# Patient Record
Sex: Female | Born: 1980 | Race: White | Hispanic: No | Marital: Married | Smoking: Never smoker
Health system: Southern US, Community
[De-identification: ages and names within clinical notes are randomized; demographics above are authoritative.]

## PROBLEM LIST (undated history)

## (undated) DIAGNOSIS — E079 Disorder of thyroid, unspecified: Secondary | ICD-10-CM

## (undated) DIAGNOSIS — F419 Anxiety disorder, unspecified: Secondary | ICD-10-CM

## (undated) HISTORY — DX: Anxiety disorder, unspecified: F41.9

## (undated) HISTORY — DX: Disorder of thyroid, unspecified: E07.9

---

## 1999-12-05 ENCOUNTER — Emergency Department (HOSPITAL_COMMUNITY): Admission: EM | Admit: 1999-12-05 | Discharge: 1999-12-05 | Payer: Self-pay | Admitting: Emergency Medicine

## 2000-03-31 ENCOUNTER — Ambulatory Visit (HOSPITAL_BASED_OUTPATIENT_CLINIC_OR_DEPARTMENT_OTHER): Admission: RE | Admit: 2000-03-31 | Discharge: 2000-04-01 | Payer: Self-pay | Admitting: Otolaryngology

## 2000-03-31 ENCOUNTER — Encounter (INDEPENDENT_AMBULATORY_CARE_PROVIDER_SITE_OTHER): Payer: Self-pay | Admitting: *Deleted

## 2000-04-19 ENCOUNTER — Other Ambulatory Visit: Admission: RE | Admit: 2000-04-19 | Discharge: 2000-04-19 | Payer: Self-pay | Admitting: Gynecology

## 2001-07-31 ENCOUNTER — Emergency Department (HOSPITAL_COMMUNITY): Admission: EM | Admit: 2001-07-31 | Discharge: 2001-08-01 | Payer: Self-pay | Admitting: Emergency Medicine

## 2002-12-12 ENCOUNTER — Other Ambulatory Visit: Admission: RE | Admit: 2002-12-12 | Discharge: 2002-12-12 | Payer: Self-pay | Admitting: Gynecology

## 2003-09-24 ENCOUNTER — Other Ambulatory Visit: Admission: RE | Admit: 2003-09-24 | Discharge: 2003-09-24 | Payer: Self-pay | Admitting: Gynecology

## 2004-01-03 ENCOUNTER — Inpatient Hospital Stay (HOSPITAL_COMMUNITY): Admission: AD | Admit: 2004-01-03 | Discharge: 2004-01-03 | Payer: Self-pay | Admitting: Gynecology

## 2004-02-12 ENCOUNTER — Encounter: Admission: RE | Admit: 2004-02-12 | Discharge: 2004-02-12 | Payer: Self-pay | Admitting: Gastroenterology

## 2004-04-28 ENCOUNTER — Inpatient Hospital Stay (HOSPITAL_COMMUNITY): Admission: AD | Admit: 2004-04-28 | Discharge: 2004-05-01 | Payer: Self-pay | Admitting: Gynecology

## 2004-06-15 ENCOUNTER — Other Ambulatory Visit: Admission: RE | Admit: 2004-06-15 | Discharge: 2004-06-15 | Payer: Self-pay | Admitting: Gynecology

## 2004-07-01 ENCOUNTER — Other Ambulatory Visit: Admission: RE | Admit: 2004-07-01 | Discharge: 2004-07-01 | Payer: Self-pay | Admitting: Internal Medicine

## 2004-08-06 IMAGING — US US ABDOMEN COMPLETE
1 series · 14 of 25 positions shown · non-contrast
Comparison: none

CLINICAL DATA: Nausea.
 UPPER ABDOMEN ULTRASOUND COMPLETE
 Normal gallbladder size and contour.  There is a persistent 2 mm sonodensity along the anterior wall of the gallbladder that does not move.  This is likely a small polyp.  No stones or sludge.  No biliary dilatation.  The liver, spleen, and pancreas are normal.  The IVC and aorta are normal.  
 There is mild to moderate right hydronephrosis which can be seen physiologically in the third trimester of pregnancy.  The left kidney is normal.
 IMPRESSION
 1.  There is a 2 mm gallbladder polyp.
 2.  There is mild to moderate right hydronephrosis which is probably physiological in this stage of pregnancy.
 3.  No other findings of significance.

[Series 1: unknown · 0.27mm/px · 14 of 74 slices shown]
[im 1/74]
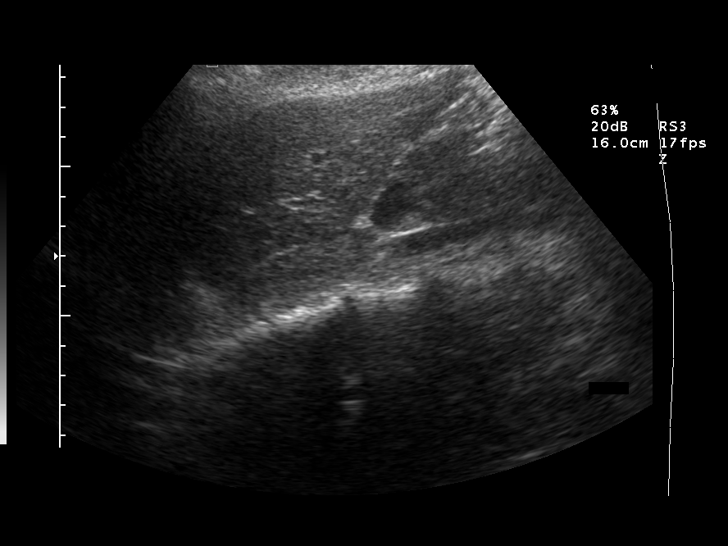
[im 7/74]
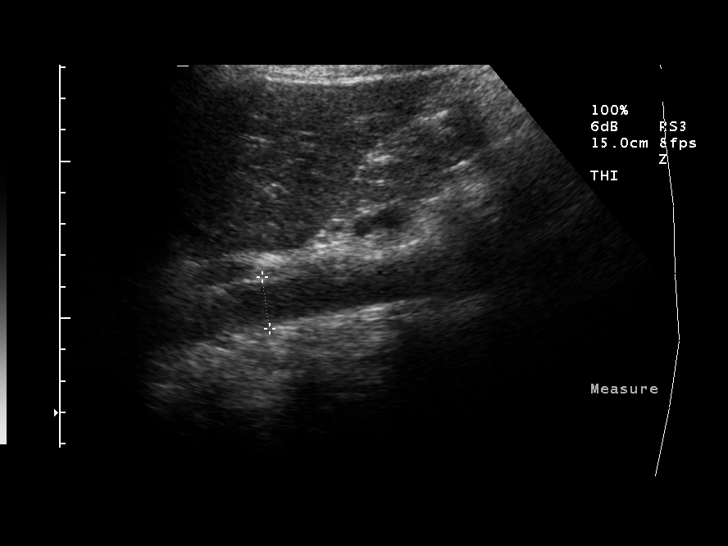
[im 13/74]
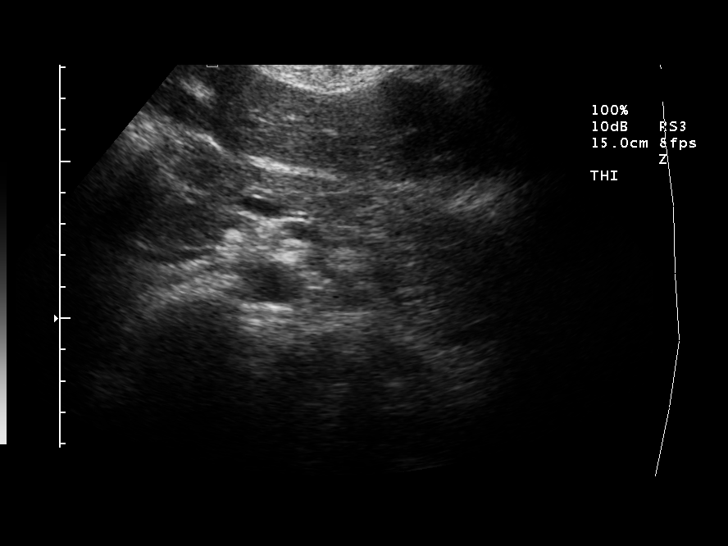
[im 19/74]
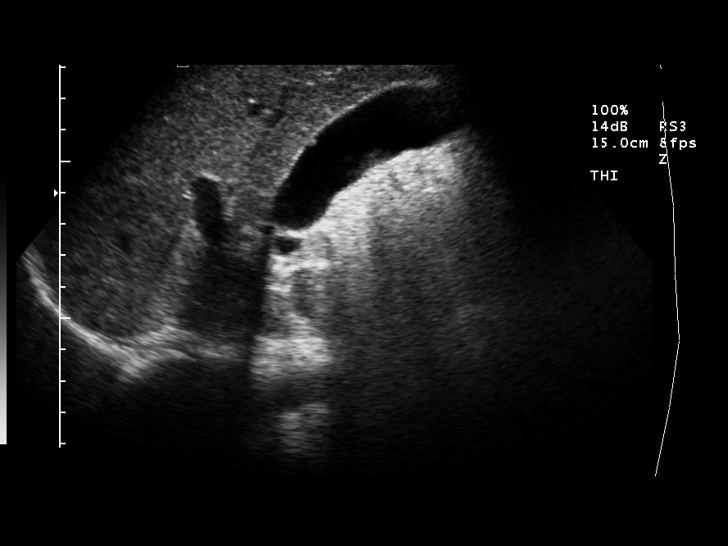
[im 25/74]
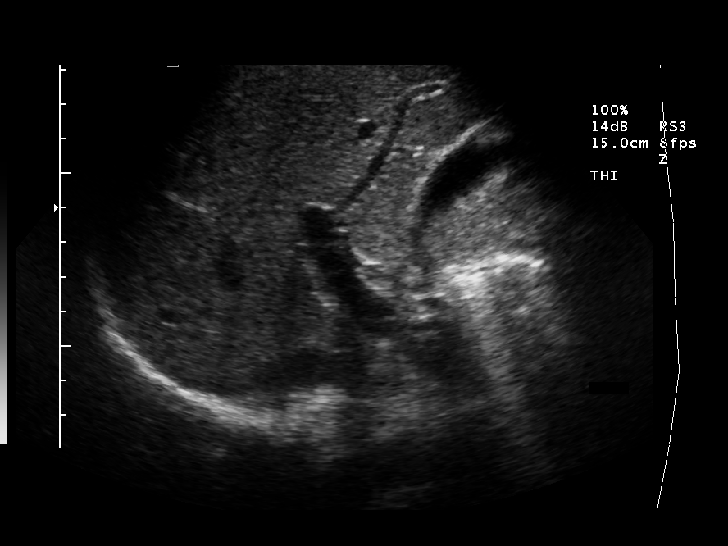
[im 28/74]
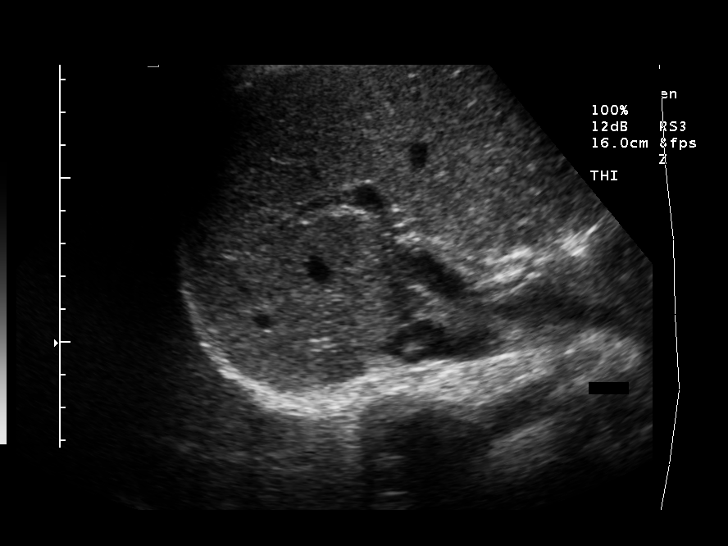
[im 34/74]
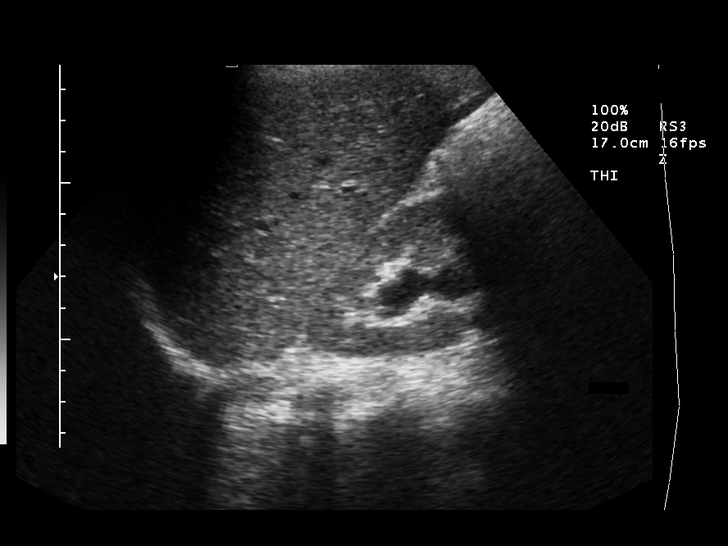
[im 40/74]
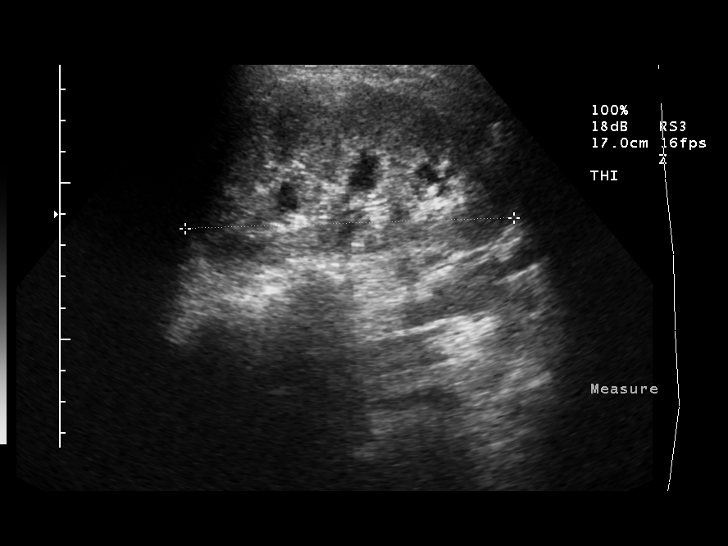
[im 46/74]
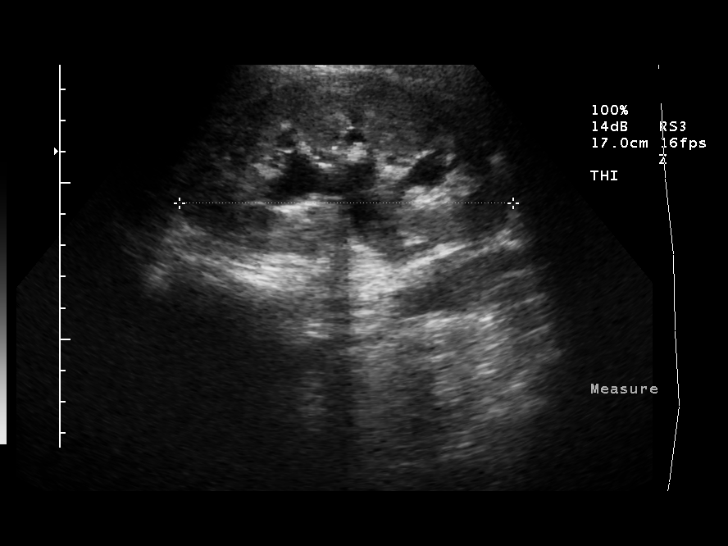
[im 49/74]
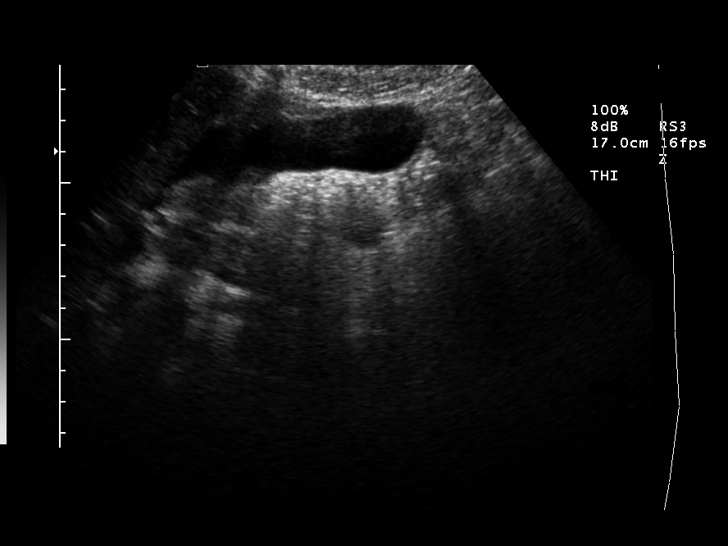
[im 55/74]
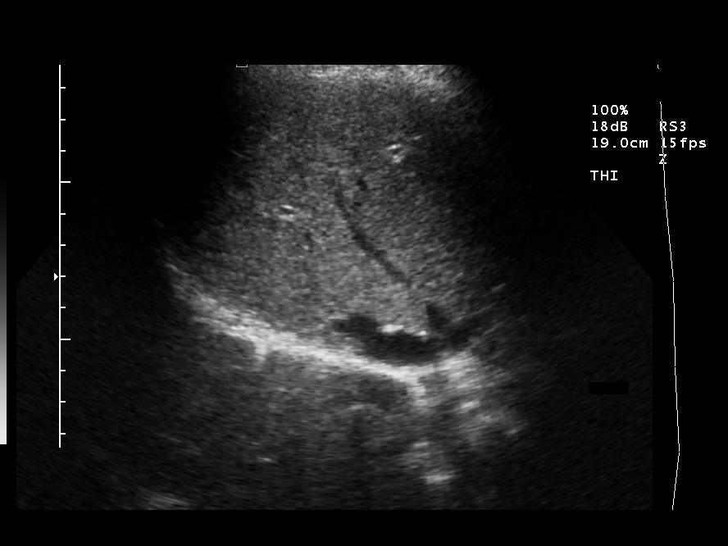
[im 61/74]
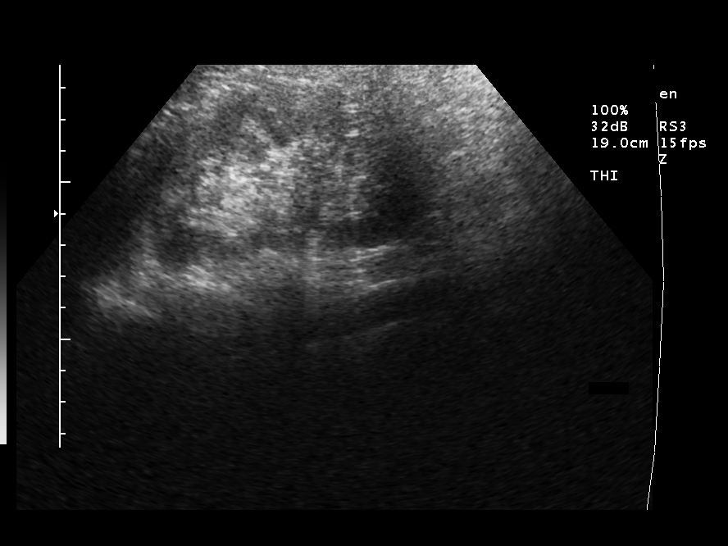
[im 67/74]
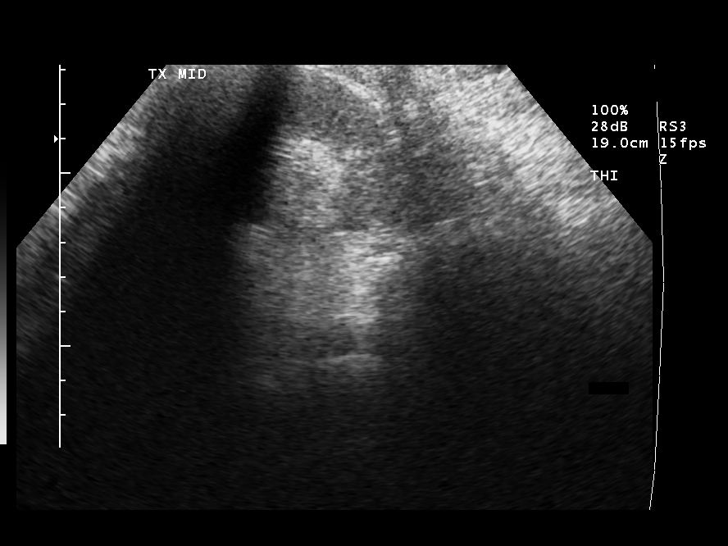
[im 74/74]
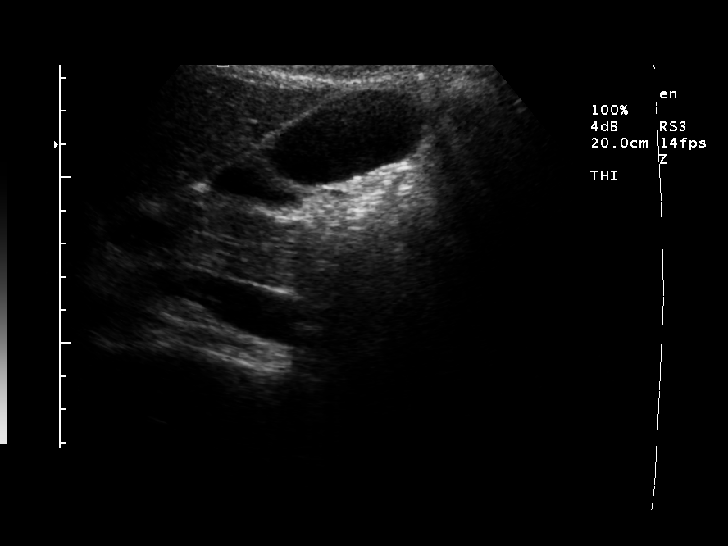

[14 of 25 positions shown; findings below may reference images not displayed]

## 2004-09-24 ENCOUNTER — Ambulatory Visit: Payer: Self-pay | Admitting: Internal Medicine

## 2004-09-27 ENCOUNTER — Encounter: Admission: RE | Admit: 2004-09-27 | Discharge: 2004-09-27 | Payer: Self-pay | Admitting: Family Medicine

## 2004-11-08 ENCOUNTER — Ambulatory Visit: Payer: Self-pay | Admitting: Family Medicine

## 2005-03-09 ENCOUNTER — Other Ambulatory Visit: Admission: RE | Admit: 2005-03-09 | Discharge: 2005-03-09 | Payer: Self-pay | Admitting: Gynecology

## 2005-03-22 IMAGING — US US SOFT TISSUE HEAD/NECK
1 series · 14 of 25 positions shown · non-contrast
Comparison: none

CLINICAL DATA: Goiter on physical exam. 
THYROID ULTRASOUND: 
No comparison.  Both lobes of the thyroid are heterogeneous in echotexture.  No focal thyroid nodules are demonstrated.  The right lobe measures 6.0 x 2.5 x 2.6cm and the left lobe 5.8 x 2.2 x 2.5cm.  The isthmus measures 10mm in thickness.  Several prominent lymph nodes are noted bilaterally, all having short axis dimensions less than 1cm.  These are not pathologically enlarged by size criteria and are non specific.

[Series 1: unknown · 0.09mm/px · 14 of 53 slices shown]
[im 1/53]
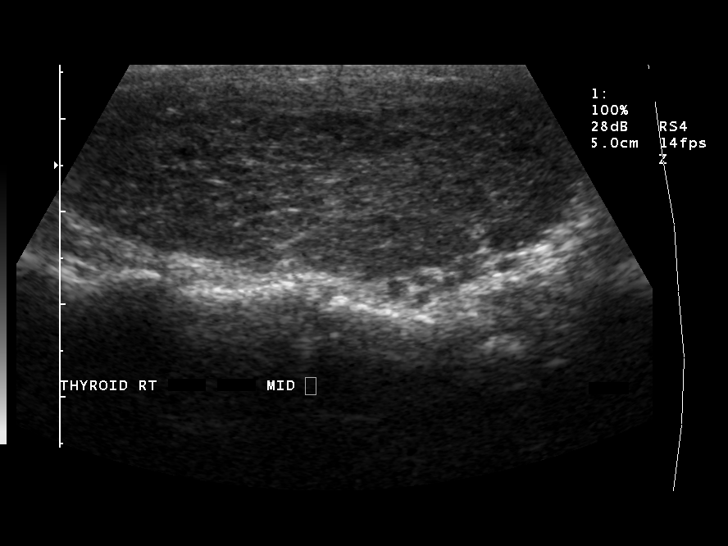
[im 5/53]
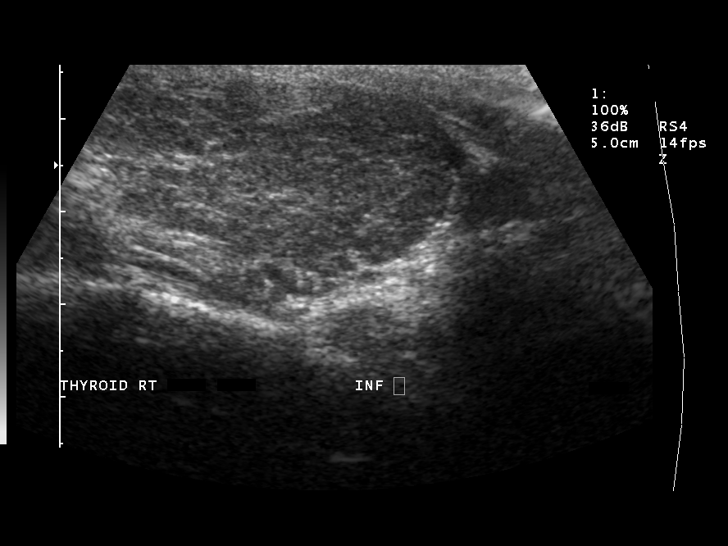
[im 9/53]
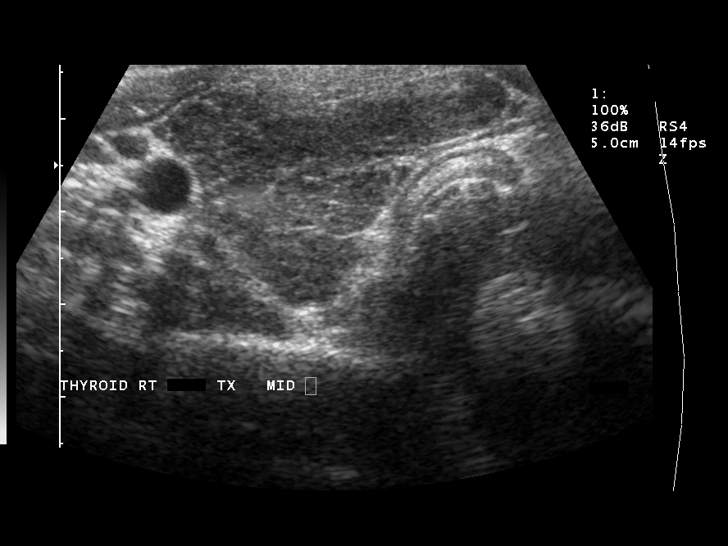
[im 14/53]
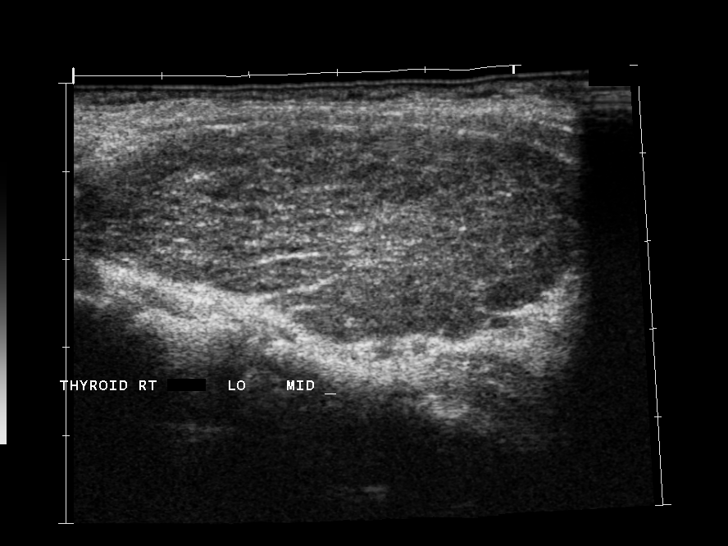
[im 18/53]
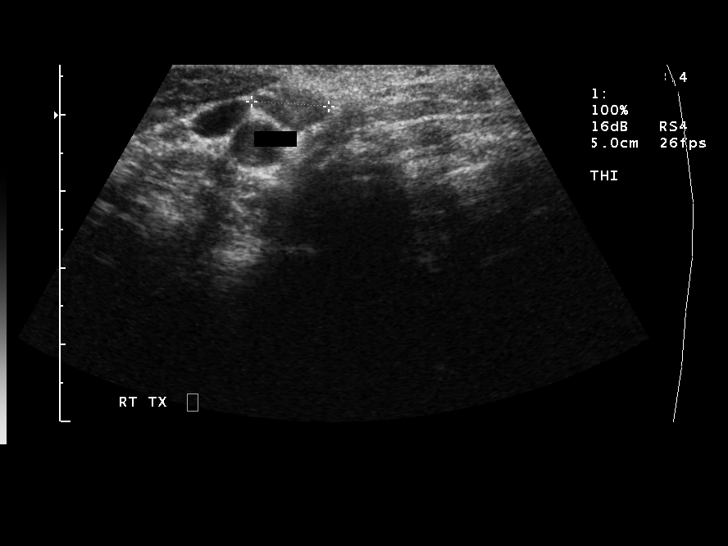
[im 20/53]
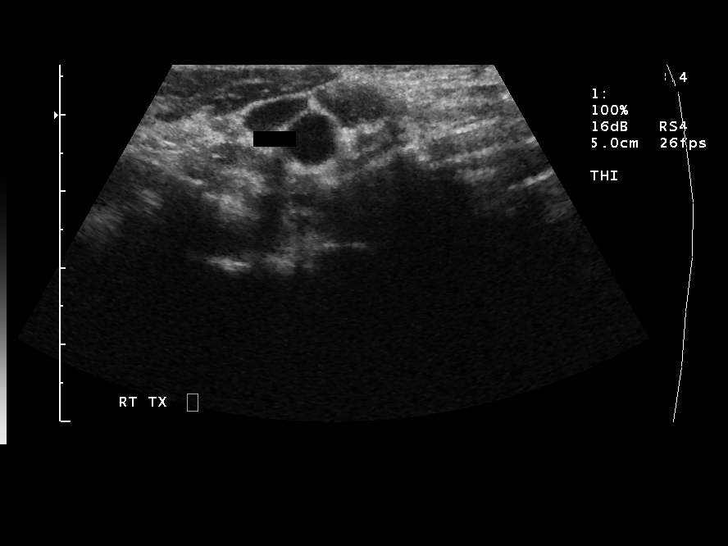
[im 24/53]
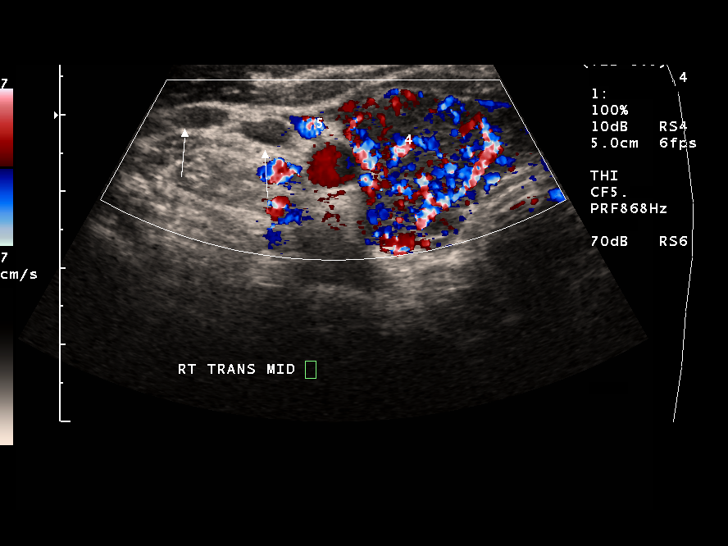
[im 29/53]
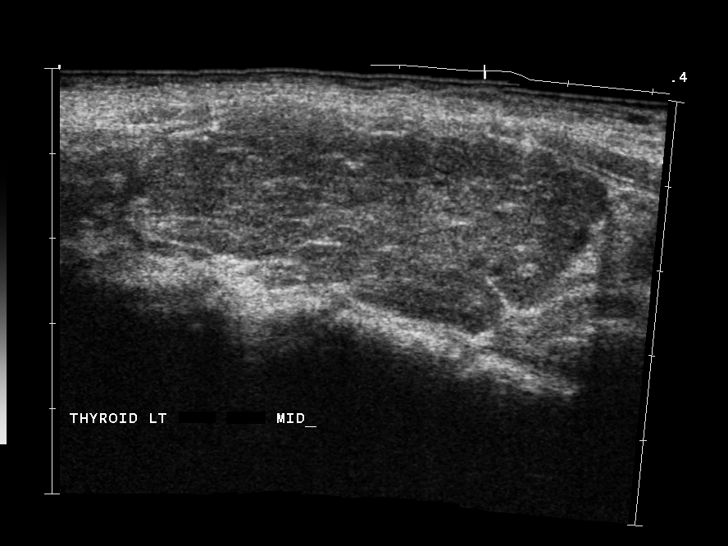
[im 33/53]
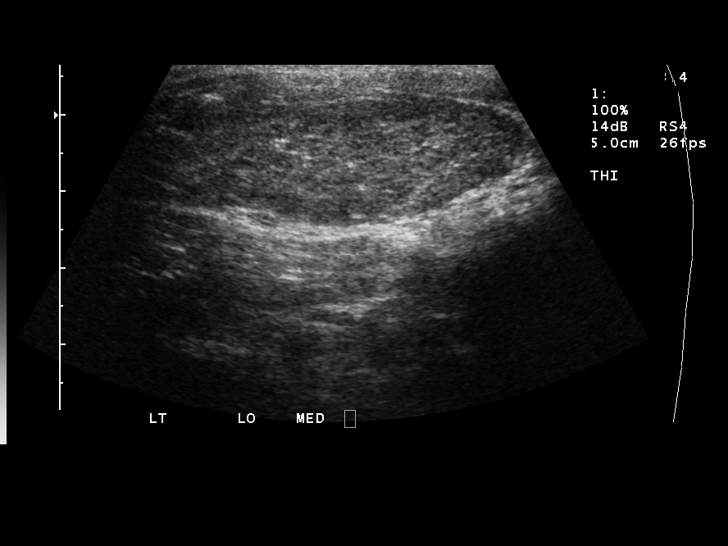
[im 35/53]
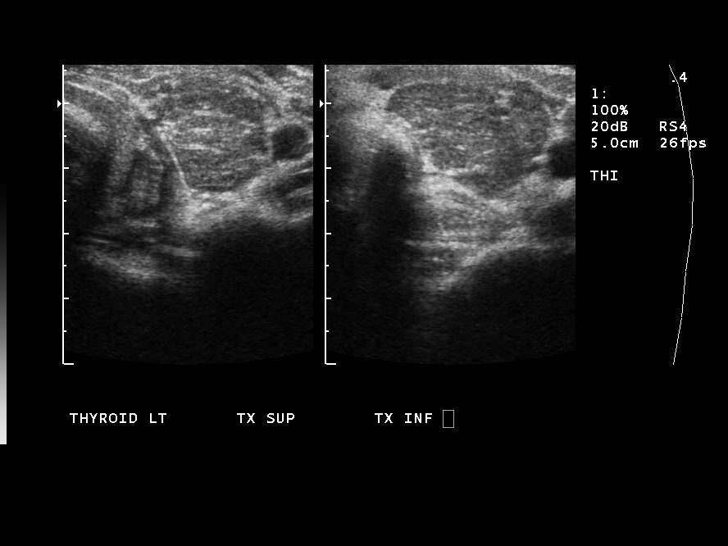
[im 40/53]
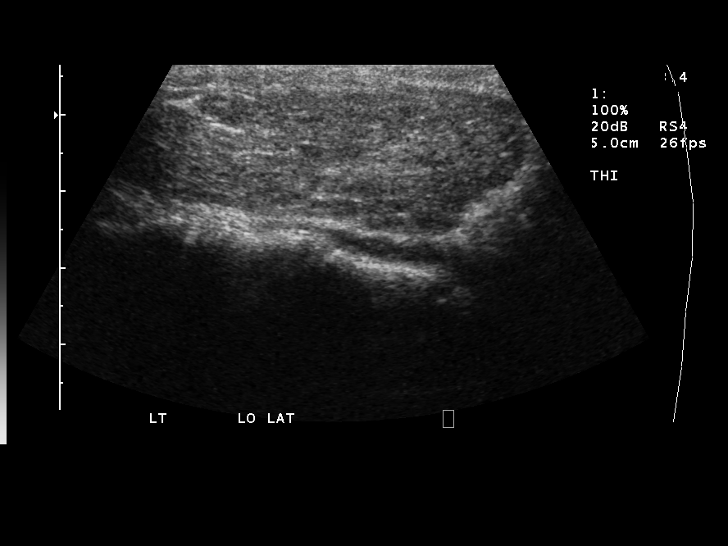
[im 44/53]
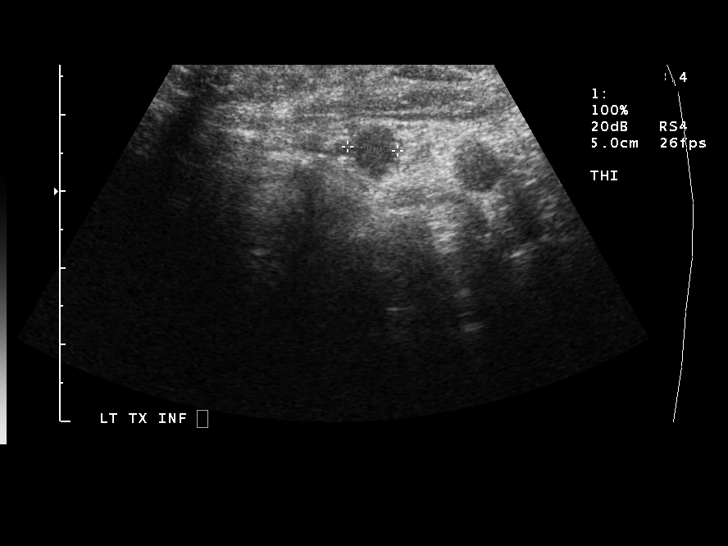
[im 48/53]
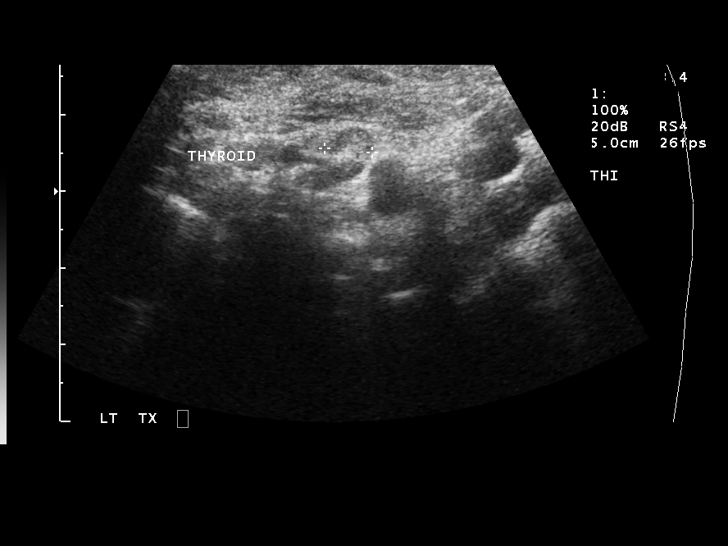
[im 53/53]
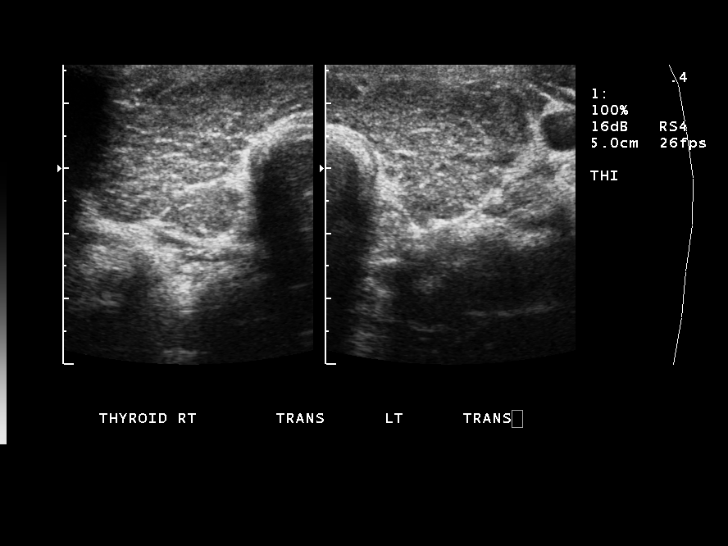

[14 of 25 positions shown; findings below may reference images not displayed]

IMPRESSION: 1.  The thyroid gland is mildly enlarged and is diffusely heterogeneous in echotexture, most compatible with a goiter.  No focal thyroid nodules are identified. 
2.  Prominent anterior cervical lymph nodes bilaterally are not pathologically enlarged by size criteria and likely physiologic.  Correlate clinically.

## 2005-07-27 ENCOUNTER — Ambulatory Visit: Payer: Self-pay | Admitting: Gastroenterology

## 2005-07-30 ENCOUNTER — Ambulatory Visit: Payer: Self-pay | Admitting: Family Medicine

## 2005-08-12 ENCOUNTER — Other Ambulatory Visit: Admission: RE | Admit: 2005-08-12 | Discharge: 2005-08-12 | Payer: Self-pay | Admitting: Gynecology

## 2005-10-11 ENCOUNTER — Ambulatory Visit: Payer: Self-pay | Admitting: Family Medicine

## 2005-10-17 ENCOUNTER — Ambulatory Visit: Payer: Self-pay | Admitting: Family Medicine

## 2006-03-01 ENCOUNTER — Ambulatory Visit: Payer: Self-pay | Admitting: Family Medicine

## 2006-03-23 ENCOUNTER — Ambulatory Visit: Payer: Self-pay | Admitting: Family Medicine

## 2007-03-22 ENCOUNTER — Inpatient Hospital Stay (HOSPITAL_COMMUNITY): Admission: AD | Admit: 2007-03-22 | Discharge: 2007-03-22 | Payer: Self-pay | Admitting: Obstetrics and Gynecology

## 2007-04-03 ENCOUNTER — Inpatient Hospital Stay (HOSPITAL_COMMUNITY): Admission: RE | Admit: 2007-04-03 | Discharge: 2007-04-05 | Payer: Self-pay | Admitting: Obstetrics and Gynecology

## 2007-08-01 ENCOUNTER — Ambulatory Visit: Payer: Self-pay | Admitting: Family Medicine

## 2007-08-01 DIAGNOSIS — M2560 Stiffness of unspecified joint, not elsewhere classified: Secondary | ICD-10-CM

## 2007-08-01 DIAGNOSIS — R519 Headache, unspecified: Secondary | ICD-10-CM | POA: Insufficient documentation

## 2007-08-01 DIAGNOSIS — E069 Thyroiditis, unspecified: Secondary | ICD-10-CM

## 2007-08-01 DIAGNOSIS — R51 Headache: Secondary | ICD-10-CM

## 2007-08-01 LAB — CONVERTED CEMR LAB
Basophils Absolute: 0 10*3/uL (ref 0.0–0.1)
Basophils Relative: 0.1 % (ref 0.0–1.0)
Eosinophils Absolute: 0 10*3/uL (ref 0.0–0.6)
Eosinophils Relative: 0.9 % (ref 0.0–5.0)
HCT: 38.5 % (ref 36.0–46.0)
Hemoglobin: 13.4 g/dL (ref 12.0–15.0)
Lymphocytes Relative: 24.5 % (ref 12.0–46.0)
MCHC: 34.8 g/dL (ref 30.0–36.0)
MCV: 88.4 fL (ref 78.0–100.0)
Monocytes Absolute: 0.5 10*3/uL (ref 0.2–0.7)
Monocytes Relative: 10.8 % (ref 3.0–11.0)
Neutro Abs: 2.9 10*3/uL (ref 1.4–7.7)
Neutrophils Relative %: 63.7 % (ref 43.0–77.0)
Platelets: 235 10*3/uL (ref 150–400)
RBC: 4.36 M/uL (ref 3.87–5.11)
RDW: 12.5 % (ref 11.5–14.6)
TSH: 13.84 microintl units/mL — ABNORMAL HIGH (ref 0.35–5.50)
WBC: 4.5 10*3/uL (ref 4.5–10.5)

## 2007-08-02 ENCOUNTER — Telehealth (INDEPENDENT_AMBULATORY_CARE_PROVIDER_SITE_OTHER): Payer: Self-pay | Admitting: Internal Medicine

## 2007-08-06 ENCOUNTER — Telehealth (INDEPENDENT_AMBULATORY_CARE_PROVIDER_SITE_OTHER): Payer: Self-pay | Admitting: *Deleted

## 2007-12-24 ENCOUNTER — Ambulatory Visit: Payer: Self-pay | Admitting: Family Medicine

## 2007-12-24 DIAGNOSIS — E039 Hypothyroidism, unspecified: Secondary | ICD-10-CM | POA: Insufficient documentation

## 2007-12-24 DIAGNOSIS — IMO0002 Reserved for concepts with insufficient information to code with codable children: Secondary | ICD-10-CM

## 2007-12-24 DIAGNOSIS — L659 Nonscarring hair loss, unspecified: Secondary | ICD-10-CM | POA: Insufficient documentation

## 2007-12-26 ENCOUNTER — Telehealth (INDEPENDENT_AMBULATORY_CARE_PROVIDER_SITE_OTHER): Payer: Self-pay | Admitting: Internal Medicine

## 2007-12-27 LAB — CONVERTED CEMR LAB
BUN: 13 mg/dL (ref 6–23)
Basophils Absolute: 0.1 10*3/uL (ref 0.0–0.1)
Basophils Relative: 2 % — ABNORMAL HIGH (ref 0.0–1.0)
CO2: 30 meq/L (ref 19–32)
Calcium: 10 mg/dL (ref 8.4–10.5)
Chloride: 105 meq/L (ref 96–112)
Creatinine, Ser: 0.9 mg/dL (ref 0.4–1.2)
Eosinophils Absolute: 0.1 10*3/uL (ref 0.0–0.6)
Eosinophils Relative: 1.7 % (ref 0.0–5.0)
GFR calc Af Amer: 96 mL/min
GFR calc non Af Amer: 79 mL/min
Glucose, Bld: 99 mg/dL (ref 70–99)
HCT: 40.9 % (ref 36.0–46.0)
Hemoglobin: 13.7 g/dL (ref 12.0–15.0)
Lymphocytes Relative: 35.6 % (ref 12.0–46.0)
MCHC: 33.4 g/dL (ref 30.0–36.0)
MCV: 95.1 fL (ref 78.0–100.0)
Monocytes Absolute: 0.4 10*3/uL (ref 0.2–0.7)
Monocytes Relative: 6.3 % (ref 3.0–11.0)
Neutro Abs: 3.4 10*3/uL (ref 1.4–7.7)
Neutrophils Relative %: 54.4 % (ref 43.0–77.0)
Platelets: 225 10*3/uL (ref 150–400)
Potassium: 3.8 meq/L (ref 3.5–5.1)
RBC: 4.3 M/uL (ref 3.87–5.11)
RDW: 12.4 % (ref 11.5–14.6)
Sodium: 141 meq/L (ref 135–145)
TSH: 4.76 microintl units/mL (ref 0.35–5.50)
WBC: 6.2 10*3/uL (ref 4.5–10.5)

## 2008-01-04 ENCOUNTER — Telehealth (INDEPENDENT_AMBULATORY_CARE_PROVIDER_SITE_OTHER): Payer: Self-pay | Admitting: Internal Medicine

## 2008-01-25 ENCOUNTER — Ambulatory Visit: Payer: Self-pay | Admitting: Internal Medicine

## 2008-01-28 LAB — CONVERTED CEMR LAB: TSH: 2.79 microintl units/mL (ref 0.35–5.50)

## 2008-07-30 ENCOUNTER — Encounter (INDEPENDENT_AMBULATORY_CARE_PROVIDER_SITE_OTHER): Payer: Self-pay | Admitting: *Deleted

## 2009-05-21 HISTORY — PX: STRABISMUS SURGERY: SHX218

## 2009-07-22 ENCOUNTER — Ambulatory Visit: Payer: Self-pay | Admitting: Family Medicine

## 2009-07-22 DIAGNOSIS — F411 Generalized anxiety disorder: Secondary | ICD-10-CM | POA: Insufficient documentation

## 2009-07-22 LAB — CONVERTED CEMR LAB
Bilirubin Urine: NEGATIVE
Blood in Urine, dipstick: NEGATIVE
Glucose, Urine, Semiquant: NEGATIVE
Ketones, urine, test strip: NEGATIVE
Nitrite: NEGATIVE
Protein, U semiquant: NEGATIVE
Specific Gravity, Urine: <1.005
Urobilinogen, UA: NEGATIVE
WBC Urine, dipstick: NEGATIVE
pH: 7.5

## 2009-07-24 LAB — CONVERTED CEMR LAB
ALT: 16 units/L (ref 0–35)
AST: 10 units/L (ref 0–37)
Albumin: 4.4 g/dL (ref 3.5–5.2)
Alkaline Phosphatase: 47 units/L (ref 39–117)
BUN: 11 mg/dL (ref 6–23)
Basophils Absolute: 0 10*3/uL (ref 0.0–0.1)
Basophils Relative: 0.2 % (ref 0.0–3.0)
Bilirubin, Direct: 0 mg/dL (ref 0.0–0.3)
CO2: 28 meq/L (ref 19–32)
Calcium: 10.1 mg/dL (ref 8.4–10.5)
Chloride: 107 meq/L (ref 96–112)
Cholesterol: 161 mg/dL (ref 0–200)
Creatinine, Ser: 0.8 mg/dL (ref 0.4–1.2)
Eosinophils Absolute: 0.1 10*3/uL (ref 0.0–0.7)
Eosinophils Relative: 1.5 % (ref 0.0–5.0)
Free T4: 0.8 ng/dL (ref 0.6–1.6)
GFR calc non Af Amer: 89.68 mL/min (ref >60)
Glucose, Bld: 81 mg/dL (ref 70–99)
HCT: 43.8 % (ref 36.0–46.0)
HDL: 64.3 mg/dL (ref >39.00)
Hemoglobin: 14.9 g/dL (ref 12.0–15.0)
LDL Cholesterol: 86 mg/dL (ref 0–99)
Lymphocytes Relative: 38.3 % (ref 12.0–46.0)
Lymphs Abs: 2.1 10*3/uL (ref 0.7–4.0)
MCHC: 34 g/dL (ref 30.0–36.0)
MCV: 98.8 fL (ref 78.0–100.0)
Monocytes Absolute: 0.4 10*3/uL (ref 0.1–1.0)
Monocytes Relative: 7.4 % (ref 3.0–12.0)
Neutro Abs: 2.8 10*3/uL (ref 1.4–7.7)
Neutrophils Relative %: 52.6 % (ref 43.0–77.0)
Platelets: 152 10*3/uL (ref 150.0–400.0)
Potassium: 4.8 meq/L (ref 3.5–5.1)
RBC: 4.43 M/uL (ref 3.87–5.11)
RDW: 12.1 % (ref 11.5–14.6)
Sodium: 140 meq/L (ref 135–145)
T3, Free: 2.6 pg/mL (ref 2.3–4.2)
TSH: 2.17 microintl units/mL (ref 0.35–5.50)
Total Bilirubin: 0.8 mg/dL (ref 0.3–1.2)
Total CHOL/HDL Ratio: 3
Total Protein: 7.5 g/dL (ref 6.0–8.3)
Triglycerides: 52 mg/dL (ref 0.0–149.0)
VLDL: 10.4 mg/dL (ref 0.0–40.0)
WBC: 5.4 10*3/uL (ref 4.5–10.5)

## 2010-04-29 ENCOUNTER — Ambulatory Visit: Payer: Self-pay | Admitting: Family Medicine

## 2010-04-29 DIAGNOSIS — R300 Dysuria: Secondary | ICD-10-CM

## 2010-04-29 LAB — CONVERTED CEMR LAB
Bilirubin Urine: NEGATIVE
Glucose, Urine, Semiquant: NEGATIVE
Ketones, urine, test strip: NEGATIVE
Nitrite: NEGATIVE
Protein, U semiquant: NEGATIVE
Specific Gravity, Urine: 1.015
Urobilinogen, UA: 0.2
WBC Urine, dipstick: NEGATIVE
pH: 6

## 2010-06-22 ENCOUNTER — Ambulatory Visit: Payer: Self-pay | Admitting: Family Medicine

## 2010-06-22 LAB — CONVERTED CEMR LAB
Bilirubin Urine: NEGATIVE
Glucose, Urine, Semiquant: NEGATIVE
Ketones, urine, test strip: NEGATIVE
Nitrite: NEGATIVE
Specific Gravity, Urine: >=1.03
Urobilinogen, UA: 0.2
WBC Urine, dipstick: NEGATIVE
pH: 6

## 2010-06-23 ENCOUNTER — Encounter (INDEPENDENT_AMBULATORY_CARE_PROVIDER_SITE_OTHER): Payer: Self-pay | Admitting: *Deleted

## 2010-12-14 ENCOUNTER — Encounter: Payer: Self-pay | Admitting: Family Medicine

## 2010-12-14 ENCOUNTER — Ambulatory Visit
Admission: RE | Admit: 2010-12-14 | Discharge: 2010-12-14 | Payer: Self-pay | Source: Home / Self Care | Attending: Family Medicine | Admitting: Family Medicine

## 2010-12-15 LAB — CONVERTED CEMR LAB
HCT: 41.7 % (ref 36.0–46.0)
MCHC: 33.6 g/dL (ref 30.0–36.0)
MCV: 97.4 fL (ref 78.0–100.0)
Platelets: 206 10*3/uL (ref 150–400)
RDW: 12.4 % (ref 11.5–15.5)
WBC: 7.2 10*3/uL (ref 4.0–10.5)

## 2010-12-21 NOTE — Assessment & Plan Note (Signed)
Summary: possible uti/kn   Vital Signs:  Patient profile:   30 year old female Height:      67 inches (170.18 cm) Weight:      128 pounds (58.18 kg) BMI:     20.12 Temp:     98.2 degrees F (36.78 degrees C) oral BP sitting:   100 / 64  (right arm) Cuff size:   regular  Vitals Entered By: Lucious Groves CMA (June 22, 2010 3:06 PM) CC: Possible uti./kb, Dysuria Is Patient Diabetic? No Pain Assessment Patient in pain? yes     Location: abdomen Intensity: 3 Type: ache Comments Patient notes that she has been having increased frequency, blood in urine, burning, and abd pain/pressure./kb   History of Present Illness:  Dysuria      This is a 30 year old woman who presents with Dysuria.  The symptoms began 2 days ago.  The patient complains of burning with urination, urinary frequency, urgency, and hematuria, but denies vaginal discharge, vaginal itching, and vaginal sores.  The patient denies the following associated symptoms: nausea, vomiting, fever, shaking chills, flank pain, abdominal pain, back pain, pelvic pain, and arthralgias.  The patient denies the following risk factors: diabetes, prior antibiotics, immunosuppression, history of GU anomaly, history of pyelonephritis, pregnancy, history of STD, and analgesic abuse.  History is significant for recent UTI.    Current Medications (verified): 1)  Macrobid 100 Mg Caps (Nitrofurantoin Monohyd Macro) .Marland Kitchen.. 1 By Mouth Two Times A Day  Allergies (verified): No Known Drug Allergies  Past History:  Past medical, surgical, family and social histories (including risk factors) reviewed for relevance to current acute and chronic problems.  Past Medical History: Reviewed history from 07/22/2009 and no changes required. Hypothyroidism Anxiety  Past Surgical History: Reviewed history from 07/22/2009 and no changes required. 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 Family History of CAD Female 1st degree relative late 37s Family History Hypertension Family History High cholesterol PGF---colon cancer  Social History: Reviewed history from 07/22/2009 and no changes required. Occupation:  Radio producer, funding Married Never Smoked Alcohol use-yes Drug use-no Regular exercise-yes  Review of Systems      See HPI  Physical Exam  General:  Well-developed,well-nourished,in no acute distress; alert,appropriate and cooperative throughout examination Abdomen:  + suprapubic tenderness no flank pain soft, no distention, no masses, no guarding, and no rebound tenderness.   Psych:  Oriented X3 and normally interactive.     Impression & Recommendations:  Problem # 1:  DYSURIA (ICD-788.1)  Her updated medication list for this problem includes:    Macrobid 100 Mg Caps (Nitrofurantoin monohyd macro) .Marland Kitchen... 1 by mouth two times a day  Orders: T-Culture, Urine ([id number]) UA Dipstick w/o Micro (manual) (86578)  Encouraged to push clear liquids, get enough rest, and take acetaminophen as needed. To be seen in 10 days if no improvement, sooner if worse.  Complete Medication List: 1)  Macrobid 100 Mg Caps (Nitrofurantoin monohyd macro) .Marland Kitchen.. 1 by mouth two times a day  Patient Instructions: 1)  recheck UA , urine C&S in 2 weeks  Prescriptions: MACROBID 100 MG CAPS (NITROFURANTOIN MONOHYD MACRO) 1 by mouth two times a day  #14 x 0   Entered and Authorized by:   Loreen Freud DO   Signed by:   Loreen Freud DO on 06/22/2010   Method used:   Electronically to        [facility] (retail)  [address]       [place], [place]  [address]       Ph: [phone] or [phone]       Fax: [phone]   RxID:   [id number]   Laboratory Results   Urine Tests  Date/Time Received: Lucious Groves [facility]  June 22, 2010 3:11 PM Date/Time Reported: Lucious Groves CMA  June 22, 2010 3:11 PM   Routine  Urinalysis   Color: yellow Appearance: Hazy Glucose: negative   (Normal Range: Negative) Bilirubin: negative   (Normal Range: Negative) Ketone: negative   (Normal Range: Negative) Spec. Gravity: >=1.030   (Normal Range: 1.003-1.035) Blood: large   (Normal Range: Negative) pH: 6.0   (Normal Range: 5.0-8.0) Protein: trace   (Normal Range: Negative) Urobilinogen: 0.2   (Normal Range: 0-1) Nitrite: negative   (Normal Range: Negative) Leukocyte Esterace: negative   (Normal Range: Negative)

## 2010-12-21 NOTE — Assessment & Plan Note (Signed)
Summary: possible uti//kn   Vital Signs:  Patient profile:   30 year old female Height:      67 inches Weight:      128.13 pounds BMI:     20.14 Pulse rate:   68 / minute Pulse rhythm:   regular BP sitting:   110 / 76  (left arm) Cuff size:   regular  Vitals Entered By: Army Fossa CMA (April 29, 2010 10:47 AM) CC: Pt here states that she has urinate more frequently, burning, noticied some blood, Dysuria   History of Present Illness:  Dysuria      This is a 30 year old woman who presents with Dysuria.  The symptoms began 8-12 hrs ago.  The patient complains of burning with urination and urinary frequency, but denies urgency, hematuria, vaginal discharge, vaginal itching, and vaginal sores.  Associated symptoms include back pain.  The patient denies the following associated symptoms: nausea, vomiting, fever, shaking chills, flank pain, abdominal pain, pelvic pain, and arthralgias.  The patient denies the following risk factors: diabetes, prior antibiotics, immunosuppression, history of GU anomaly, history of pyelonephritis, pregnancy, history of STD, and analgesic abuse.  History is significant for no urinary tract problems.    Allergies (verified): No Known Drug Allergies  Physical Exam  General:  Well-developed,well-nourished,in no acute distress; alert,appropriate and cooperative throughout examination Abdomen:  soft, no distention, no masses, no guarding, and no rigidity.  + suprapubic tenderness Psych:  Oriented X3 and normally interactive.     Impression & Recommendations:  Problem # 1:  DYSURIA (ICD-788.1)  Her updated medication list for this problem includes:    Cipro 500 Mg Tabs (Ciprofloxacin hcl) .Marland Kitchen... 1 by mouth two times a day  Orders: T-Culture, Urine ([id number]) UA Dipstick w/o Micro (manual) (09811)  Encouraged to push clear liquids, get enough rest, and take acetaminophen as needed. To be seen in 10 days if no improvement, sooner if worse.  Complete  Medication List: 1)  Cipro 500 Mg Tabs (Ciprofloxacin hcl) .Marland Kitchen.. 1 by mouth two times a day Prescriptions: CIPRO 500 MG TABS (CIPROFLOXACIN HCL) 1 by mouth two times a day  #10 x 0   Entered and Authorized by:   Loreen Freud DO   Signed by:   Loreen Freud DO on 04/29/2010   Method used:   Print then Give to Patient   RxID:   [id number]   Laboratory Results   Urine Tests    Routine Urinalysis   Color: yellow Appearance: Clear Glucose: negative   (Normal Range: Negative) Bilirubin: negative   (Normal Range: Negative) Ketone: negative   (Normal Range: Negative) Spec. Gravity: 1.015   (Normal Range: 1.003-1.035) Blood: small   (Normal Range: Negative) pH: 6.0   (Normal Range: 5.0-8.0) Protein: negative   (Normal Range: Negative) Urobilinogen: 0.2   (Normal Range: 0-1) Nitrite: negative   (Normal Range: Negative) Leukocyte Esterace: negative   (Normal Range: Negative)    Comments: Army Fossa CMA  April 29, 2010 10:55 AM

## 2010-12-21 NOTE — Letter (Signed)
Summary: Erica Brewer letter  Erica Brewer at [facility]  [address] [place], [place] [address]   Phone: [phone]  Fax: [phone]       06/23/2010 MRN: [id number]  Erica Brewer [address] [place], [place]  [address]  Dear Ms. [name],  [facility] [place], and [facility] announce the retirement of Arta Silence, M.D., from full-time practice at the [facility] office effective May 20, 2010 and his plans of returning part-time.  It is important to Dr. Hetty Ely and to our practice that you understand that [facility] has seven physicians in our office for your health care needs.  We will continue to offer the same exceptional care that you have today.    Dr. Hetty Ely has spoken to many of you about his plans for retirement and returning part-time in the fall.   We will continue to work with you through the transition to schedule appointments for you in the office and meet the high standards that [place] is committed to.   Again, it is with great pleasure that we share the news that Dr. Hetty Ely will return to [facility] at [facility] in October of 2011 with a reduced schedule.    If you have any questions, or would like to request an appointment with one of our physicians, please call us at [phone] and press the option for Scheduling an appointment.  We take pleasure in providing you with excellent patient care and look forward to seeing you at your next office visit.  Our [facility] Physicians are:  Tillman Abide, M.D. Laurita Quint, M.D. Roxy Manns, M.D. Kerby Nora, M.D. Hannah Beat, M.D. Ruthe Mannan, M.D. We proudly welcomed Raechel Ache, M.D. and Eustaquio Boyden, M.D. to the practice in July/August 2011.  Sincerely,  [facility]

## 2011-04-08 NOTE — Op Note (Signed)
[name]  Patient:    Erica Brewer, Erica Brewer                  MRN: [id number] Proc. Date: 03/31/00 Adm. Date:  [id number] Disc. Date: [id number] Attending:  Lucky Cowboy CC:         [facility]             Rosalyn Gess. Norins, M.D. [facility]                           Operative Report  PREOPERATIVE DIAGNOSIS:  Chronic tonsillitis.  POSTOPERATIVE DIAGNOSIS:  Chronic tonsillitis.  PROCEDURE:  Tonsillectomy.  SURGEON:  Dr. Vinie Sill  ANESTHESIA:  General endotracheal anesthesia.  ESTIMATED BLOOD LOSS:  50 cc.  SPECIMENS:  Tonsils.  INDICATIONS:  The patient is a 30 year old female with an 8 year history of recurrent tonsillitis.  She experiences at least 3 episodes per year of non-strep tonsillitis.  These have been increasing in frequency.  She did develop mono and tonsillitis at 30 years of age and has had problems since that time.  She has required emergency room evaluation for airway difficulty with previous infections.  FINDINGS:  The patient was noted to have 2+ bilateral palatine tonsils which were markedly cryptic, necrotic, and containing tonsilliths.  PROCEDURE:  The patient was taken to the operating room and placed on the table in the supine position.  She was then placed under general endotracheal anesthesia and the table rotated counterclockwise 90 degrees.  The neck was then gently extended using a shoulder roll.  The head and body were draped. Bacitracin ointment was placed on the lips.  A Crowe-Davis mouth gag with a #3 tongue blade was then placed intraorally, opened and suspended on the Mayo stand.  Inspection of the nasopharynx revealed a very minimal amount of adenoid pad. The right palatine tonsil was grasped with Allis clamps and directed inferomedially.  A #12 blade was used to make an incision on the interior tonsillar pillar.  A Hurd was used to dissect the tonsil ______ inferolateral attachment.  A snare  was then used to resect the tonsil.  Two sterile gauze packs were placed.  The patient was turned to the left tonsil which was removed in identical fashion and 2 sterile gauze packs placed.  The packs were removed after 5 minutes.  Point hemostasis was achieved using suction cautery.  The mouth gag was relaxed and reopened noting no residual bleeding.  An NG tube was placed down the esophagus for suction of the gastric contents.  The mouth gag was removed noting no damage to the teeth or soft tissues.  The table was rotated clockwise 90 degrees to its original position. The patient was then awakened from anesthesia and extubated in the operating room. She was taken to the post anesthesia care unit in stable condition. DD:  03/31/00 TD:  04/03/00 Job: [id number]

## 2011-04-08 NOTE — H&P (Signed)
NAME:  Erica Brewer, Erica Brewer                         ACCOUNT NO.:  [id number]   MEDICAL RECORD NO.:  [id number]                   PATIENT TYPE:   LOCATION:                                       FACILITY:   PHYSICIAN:  Timothy P. Fontaine, M.D.           DATE OF BIRTH:   DATE OF ADMISSION:  DATE OF DISCHARGE:                                HISTORY & PHYSICAL   CHIEF COMPLAINT:  1. Pregnancy at 40 weeks.  2. History of maternal hydronephrosis.   HISTORY OF PRESENT ILLNESS:  A 30 year old G1 P0 female at [redacted] weeks  gestation being followed for maternal hydronephrosis suspected to be  secondary to uterine compression.  She is admitted at this time for  induction of labor.  For the remainder of her history see her [place].   PHYSICAL EXAMINATION:  HEENT:  Normal.  LUNGS:  Clear.  CARDIAC:  Regular rate without rubs, murmurs, or gallops.  ABDOMEN:  Gravid, vertex fetus, positive fetal heart tones.  PELVIC:  Reveals 50%, closed, -1 station, vertex presentation.   ASSESSMENT AND PLAN:  A 30 year old gravida 1 para 0 at [redacted] weeks gestation,  history of maternal hydronephrosis suspected secondary to ureteral  compression, for induction of labor to allow decompression of the ureter and  relief of her pain symptoms.  She is beta strep negative.  Will plan on  Cervidil in the p.m. with Pitocin in the a.m.  The patient understands and  agrees with the plan.                                               Timothy P. Audie Box, M.D.    TPF/MEDQ  D:  04/28/2004  T:  04/28/2004  Job:  [id number]

## 2011-04-08 NOTE — H&P (Signed)
NAME:  Erica Brewer, Erica Brewer                     ACCOUNT NO.:  [id number]   MEDICAL RECORD NO.:  [id number]                   PATIENT TYPE:  [facility]   LOCATION:  [facility]                                 FACILITY:  [facility]   PHYSICIAN:  James A. Ashley Royalty, M.D.             DATE OF BIRTH:  Oct 09, 1981   DATE OF ADMISSION:  01/03/2004  DATE OF DISCHARGE:                                HISTORY & PHYSICAL   HISTORY OF PRESENT ILLNESS:  This is a 30 year old at approximately [redacted] weeks  gestation who presented complaining of vaginal spotting and cramping, with  onset earlier today.  She denies any recent sexual activity.  She states the  bleeding is definitely less than a period.  She denies having any other  pregnancy complications to date.  She reports slight discomfort with  urination.  Last urinary tract infection was approximately two years ago.   MEDICATIONS:  Prenatal vitamins.   PAST MEDICAL HISTORY:   MEDICAL:  Negative.   SURGICAL:  T&A.   ALLERGIES:  Negative.   FAMILY HISTORY:  Noncontributory.   SOCIAL HISTORY:  Patient denies the use of tobacco or alcohol.   REVIEW OF SYSTEMS:  Noncontributory.   PHYSICAL EXAMINATION:  GENERAL:  Well developed, well nourished, pleasant  black female in no acute distress.  VITAL SIGNS:  Afebrile and vital signs are stable.  SKIN:  Warm and dry without lesions.  CHEST:  Lungs are clear.  CARDIAC:  Regular rate and rhythm.  BREASTS:  Deferred.  ABDOMEN:  Gravid with fundus noted just above the umbilicus.  Some  irritability was noted on the fetal monitor.  Fetal heart tones were noted  and quite reassuring.  PELVIC:  External genitalia was within normal limits.  Vagina and cervix  were without gross lesions.  The cervical os was closed.  There was minimal,  if any, blood in the vaginal vault.   LABORATORY DATA:  Urinalysis was obtained from a clean catch specimen, which  revealed a specific gravity of 0.015, large amount of hemoglobin, negative  leukocyte esterase.  Repeat catheterized specimen was obtained.  Epithelial  cells were rare, there were only 0-2 WBC's.  There were too numerous to  count RBC's and no bacteria.   DISPOSITION:  The patient was given a dose of subcutaneous terbutaline with  resolution of her irritability.   IMPRESSION:  1. Intrauterine pregnancy at [redacted] weeks gestation.  2. Uterine irritability -- resolved.  3. Hematuria -- etiology uncertain.  4. Subjective impression of urinary tract infection without objective     cooperation.   PLAN:  This patient was quite adamant about having a early UTI.  I went  ahead and gave her Macrobid one b.i.d. x7 days.  She was to call back should  she develop additional irritability or contractions or bleed greater than a  normal period.  Finally, I told her that if she should be retested after the  Macrobid for residual  hematuria, and if present consider urologic  consultation.                                               James A. Ashley Royalty, M.D.    JAM/MEDQ  D:  01/03/2004  T:  01/03/2004  Job:  [id number]

## 2011-04-08 NOTE — Op Note (Signed)
[place]  Patient:    Erica Brewer, Erica Brewer                  MRN: [id number] Proc. Date: 03/31/00 Adm. Date:  [id number] Disc. Date: [id number] Attending:  Lucky Cowboy Dictator:   Lucky Cowboy, M.D.                           Operative Report  PREOPERATIVE DIAGNOSIS:  Chronic tonsillitis.  POSTOPERATIVE DIAGNOSIS:  Chronic tonsillitis.  PROCEDURE:  Tonsillectomy.  SURGEON:  Dr. Lucky Cowboy.  ANESTHESIA:  General endotracheal anesthesia.  ESTIMATED BLOOD LOSS:  50 cc.  SPECIMENS:  None.  COMPLICATIONS:  None.  INDICATIONS:  The patient is a 30 year old female with an eight year history of recurrent tonsillitis. DD:  03/31/00 TD:  04/03/00 Job: [id number]

## 2011-07-12 ENCOUNTER — Ambulatory Visit: Payer: Self-pay | Admitting: Family Medicine

## 2011-07-14 ENCOUNTER — Ambulatory Visit (INDEPENDENT_AMBULATORY_CARE_PROVIDER_SITE_OTHER): Payer: BC Managed Care – PPO | Admitting: Family Medicine

## 2011-07-14 ENCOUNTER — Encounter: Payer: Self-pay | Admitting: Family Medicine

## 2011-07-14 VITALS — BP 100/70 | HR 78 | Temp 98.7°F | Wt 127.4 lb

## 2011-07-14 DIAGNOSIS — F411 Generalized anxiety disorder: Secondary | ICD-10-CM

## 2011-07-14 DIAGNOSIS — F419 Anxiety disorder, unspecified: Secondary | ICD-10-CM

## 2011-07-14 MED ORDER — ESCITALOPRAM OXALATE 10 MG PO TABS: 10.0000 mg | ORAL_TABLET | Freq: Every day | ORAL | Status: DC

## 2011-07-14 MED ORDER — ALPRAZOLAM 0.25 MG PO TABS: 0.2500 mg | ORAL_TABLET | Freq: Three times a day (TID) | ORAL | Status: DC | PRN

## 2011-07-14 NOTE — Assessment & Plan Note (Signed)
lexapro 10 mg 1 po qd  Xanax 1 po tid  rto 1 month if unable to get appointment with psych by then Call or rto prn

## 2011-07-14 NOTE — Progress Notes (Signed)
  Subjective:    Patient ID: Erica Brewer, female    DOB: 1981/09/12, 30 y.o.   MRN: [id number]  HPI Pt here with c/o anxiety worsening over the last several months.  + mood swings and her husband told her she should see someone.  Pt is interested in counseling.  She has been on Effexor in past which made her feel more anxious and zoloft which decreased her libido.  Pt is not suicidal and states anxiety does run in family.   Review of Systems    as above Objective:   Physical Exam  Constitutional: She appears well-developed and well-nourished.  Psychiatric: Her speech is normal and behavior is normal. Judgment and thought content normal. Her mood appears anxious. Her affect is not angry, not blunt, not labile and not inappropriate. Cognition and memory are normal. She does not exhibit a depressed mood.          Assessment & Plan:

## 2011-07-14 NOTE — Patient Instructions (Signed)
Anxiety and Panic Attacks Your caregiver has informed you that you are having an anxiety or panic attack. There may be many forms of this. Most of the time these attacks come suddenly and without warning. They come at any time of day, including periods of sleep, and at any time of life. They may be strong and unexplained. Although panic attacks are very scary, they are physically harmless. Sometimes the cause of your anxiety is not known. Anxiety is a protective mechanism of the body in its fight or flight mechanism. Most of these perceived danger situations are actually nonphysical situations (such as anxiety over losing a job). CAUSES The causes of an anxiety or panic attack are many. Panic attacks may occur in otherwise healthy people given a certain set of circumstances. There may be a genetic cause for panic attacks. Some medications may also have anxiety as a side effect. SYMPTOMS Some of the most common feelings are:  Intense terror.  Dizziness, feeling faint.   Hot and cold flashes.   Fear of going crazy.   Feelings that nothing is real.   Sweating.   Shaking.   Chest pain or a fast heartbeat (palpitations).  Smothering, choking sensations.   Feelings of impending doom and that death is near.   Tingling of extremities, this may be from over breathing.   Altered reality (derealization).   Being detached from yourself (depersonalization).   Several symptoms can be present to make up anxiety or panic attacks. DIAGNOSIS The evaluation by your caregiver will depend on the type of symptoms you are experiencing. The diagnosis of anxiety or pain attack is made when no physical illness can be determined to be a cause of the symptoms. TREATMENT Treatment to prevent anxiety and panic attacks may include:  Avoidance of circumstances that cause anxiety.   Reassurance and relaxation.   Regular exercise.   Relaxation therapies, such as yoga.   Psychotherapy with a psychiatrist  or therapist.   Avoidance of caffeine, alcohol and illegal drugs.   Prescribed medication.  SEEK IMMEDIATE MEDICAL CARE IF:  You experience panic attack symptoms that are different than your usual symptoms.   You have any worsening or concerning symptoms.  Document Released: 11/07/2005 Document Re-Released: 04/27/2010 Exit[employer] Patient Information 2011 [employer]. 

## 2011-08-16 ENCOUNTER — Ambulatory Visit (INDEPENDENT_AMBULATORY_CARE_PROVIDER_SITE_OTHER): Payer: BC Managed Care – PPO | Admitting: Family Medicine

## 2011-08-16 ENCOUNTER — Encounter: Payer: Self-pay | Admitting: Family Medicine

## 2011-08-16 VITALS — BP 110/70 | HR 61 | Temp 98.5°F | Wt 126.2 lb

## 2011-08-16 DIAGNOSIS — F411 Generalized anxiety disorder: Secondary | ICD-10-CM

## 2011-08-16 DIAGNOSIS — F419 Anxiety disorder, unspecified: Secondary | ICD-10-CM

## 2011-08-16 MED ORDER — BUPROPION HCL ER (XL) 150 MG PO TB24: ORAL_TABLET | ORAL | Status: DC

## 2011-08-16 MED ORDER — ALPRAZOLAM 0.25 MG PO TABS: 0.2500 mg | ORAL_TABLET | Freq: Three times a day (TID) | ORAL | Status: DC | PRN

## 2011-08-16 MED ORDER — BUPROPION HCL ER (XL) 300 MG PO TB24: 300.0000 mg | ORAL_TABLET | ORAL | Status: DC

## 2011-08-16 NOTE — Patient Instructions (Signed)
Anxiety and Panic Attacks Your caregiver has informed you that you are having an anxiety or panic attack. There may be many forms of this. Most of the time these attacks come suddenly and without warning. They come at any time of day, including periods of sleep, and at any time of life. They may be strong and unexplained. Although panic attacks are very scary, they are physically harmless. Sometimes the cause of your anxiety is not known. Anxiety is a protective mechanism of the body in its fight or flight mechanism. Most of these perceived danger situations are actually nonphysical situations (such as anxiety over losing a job). CAUSES The causes of an anxiety or panic attack are many. Panic attacks may occur in otherwise healthy people given a certain set of circumstances. There may be a genetic cause for panic attacks. Some medications may also have anxiety as a side effect. SYMPTOMS Some of the most common feelings are:  Intense terror.  Dizziness, feeling faint.   Hot and cold flashes.   Fear of going crazy.   Feelings that nothing is real.   Sweating.   Shaking.   Chest pain or a fast heartbeat (palpitations).  Smothering, choking sensations.   Feelings of impending doom and that death is near.   Tingling of extremities, this may be from over breathing.   Altered reality (derealization).   Being detached from yourself (depersonalization).   Several symptoms can be present to make up anxiety or panic attacks. DIAGNOSIS The evaluation by your caregiver will depend on the type of symptoms you are experiencing. The diagnosis of anxiety or pain attack is made when no physical illness can be determined to be a cause of the symptoms. TREATMENT Treatment to prevent anxiety and panic attacks may include:  Avoidance of circumstances that cause anxiety.   Reassurance and relaxation.   Regular exercise.   Relaxation therapies, such as yoga.   Psychotherapy with a psychiatrist  or therapist.   Avoidance of caffeine, alcohol and illegal drugs.   Prescribed medication.  SEEK IMMEDIATE MEDICAL CARE IF:  You experience panic attack symptoms that are different than your usual symptoms.   You have any worsening or concerning symptoms.  Document Released: 11/07/2005 Document Re-Released: 04/27/2010 Exit[employer] Patient Information 2011 [employer]. 

## 2011-08-16 NOTE — Progress Notes (Signed)
  Subjective:     Erica Brewer is a 30 y.o. female who presents for follow up of anxiety disorder. Current symptoms: none. She denies current suicidal and homicidal ideation. She complains of the following side effects from the treatment: dry mouth and sexual dysfunction.  The following portions of the patient's history were reviewed and updated as appropriate: allergies, current medications, past family history, past medical history, past social history, past surgical history and problem list.    Objective:    BP 110/70  Pulse 61  Temp(Src) 98.5 F (36.9 C) (Oral)  Wt 126 lb 3.2 oz (57.244 kg)  SpO2 98%  General:  alert, cooperative, appears stated age and no distress  Affect/Behavior:  full facial expressions, good grooming, good insight, normal perception, normal reasoning, normal speech pattern and content and normal thought patterns normal      Assessment:    Anxiety Disorder - improving    Plan:    Medications: benzodiazepines cont xanax, Lexapro and Wellbutrin. Handouts describing disease, natural history, and treatment were given to the patient. Follow up: 1 month. --or sooner prn

## 2011-09-19 ENCOUNTER — Other Ambulatory Visit: Payer: Self-pay | Admitting: Family Medicine

## 2011-09-19 DIAGNOSIS — F419 Anxiety disorder, unspecified: Secondary | ICD-10-CM

## 2011-09-20 MED ORDER — ALPRAZOLAM 0.25 MG PO TABS: 0.2500 mg | ORAL_TABLET | Freq: Three times a day (TID) | ORAL | Status: DC | PRN

## 2011-09-20 NOTE — Telephone Encounter (Signed)
Last seen and filled 08/16/11... Please advise    KP

## 2011-09-20 NOTE — Telephone Encounter (Signed)
Faxed.   KP 

## 2011-10-25 ENCOUNTER — Other Ambulatory Visit: Payer: Self-pay | Admitting: Family Medicine

## 2011-10-28 ENCOUNTER — Telehealth: Payer: Self-pay | Admitting: Family Medicine

## 2011-10-28 DIAGNOSIS — F419 Anxiety disorder, unspecified: Secondary | ICD-10-CM

## 2011-10-28 MED ORDER — ALPRAZOLAM 0.25 MG PO TABS: 0.2500 mg | ORAL_TABLET | Freq: Three times a day (TID) | ORAL | Status: DC | PRN

## 2011-10-28 NOTE — Telephone Encounter (Signed)
Refill Xanax - [facility]

## 2011-10-28 NOTE — Telephone Encounter (Signed)
Faxed.   KP 

## 2011-10-28 NOTE — Telephone Encounter (Signed)
Last seen 08/16/11 and filled 09/20/11 # 30 please advise    KP

## 2011-10-28 NOTE — Telephone Encounter (Signed)
Refill x1 

## 2011-11-17 ENCOUNTER — Other Ambulatory Visit: Payer: Self-pay | Admitting: Family Medicine

## 2011-11-17 DIAGNOSIS — F419 Anxiety disorder, unspecified: Secondary | ICD-10-CM

## 2011-11-17 MED ORDER — ALPRAZOLAM 0.25 MG PO TABS: 0.2500 mg | ORAL_TABLET | Freq: Three times a day (TID) | ORAL | Status: DC | PRN

## 2011-11-17 NOTE — Telephone Encounter (Signed)
Last seen 08/16/11 and filled 10/28/11 # 30 please advise    KP--- Laury Axon patient

## 2011-12-03 ENCOUNTER — Other Ambulatory Visit: Payer: Self-pay | Admitting: Family Medicine

## 2011-12-05 ENCOUNTER — Telehealth: Payer: Self-pay | Admitting: Family Medicine

## 2011-12-05 NOTE — Telephone Encounter (Signed)
Too early

## 2011-12-05 NOTE — Telephone Encounter (Signed)
[place] pt please advise 

## 2011-12-05 NOTE — Telephone Encounter (Signed)
Last seen 08/16/11 and filled 11/17/11 # 30. Please advise    KP

## 2011-12-05 NOTE — Telephone Encounter (Signed)
Last seen 08/16/11, should have follow up in 08/2011.  30 days given and appt made with patient for 12/19/11 to follow up.

## 2011-12-14 LAB — HM PAP SMEAR: HM Pap smear: NORMAL

## 2011-12-21 ENCOUNTER — Encounter: Payer: Self-pay | Admitting: Family Medicine

## 2011-12-21 ENCOUNTER — Ambulatory Visit (INDEPENDENT_AMBULATORY_CARE_PROVIDER_SITE_OTHER): Payer: BC Managed Care – PPO | Admitting: Family Medicine

## 2011-12-21 VITALS — BP 106/64 | HR 72 | Temp 98.7°F | Wt 126.6 lb

## 2011-12-21 DIAGNOSIS — F411 Generalized anxiety disorder: Secondary | ICD-10-CM

## 2011-12-21 DIAGNOSIS — Z23 Encounter for immunization: Secondary | ICD-10-CM

## 2011-12-21 DIAGNOSIS — F419 Anxiety disorder, unspecified: Secondary | ICD-10-CM

## 2011-12-21 MED ORDER — BUPROPION HCL ER (XL) 300 MG PO TB24: 300.0000 mg | ORAL_TABLET | ORAL | Status: DC

## 2011-12-21 MED ORDER — ESCITALOPRAM OXALATE 10 MG PO TABS: ORAL_TABLET | ORAL | Status: DC

## 2011-12-21 MED ORDER — ALPRAZOLAM 0.25 MG PO TABS: 0.2500 mg | ORAL_TABLET | Freq: Three times a day (TID) | ORAL | Status: DC | PRN

## 2011-12-21 NOTE — Patient Instructions (Signed)

## 2011-12-21 NOTE — Progress Notes (Signed)
  Subjective:     Erica Brewer is a 31 y.o. female who presents for follow up of anxiety disorder. Current symptoms: none. She denies current suicidal and homicidal ideation. She complains of the following side effects from the treatment: none.  The following portions of the patient's history were reviewed and updated as appropriate: allergies, current medications, past family history, past medical history, past social history, past surgical history and problem list.    Objective:    BP 106/64  Pulse 72  Temp(Src) 98.7 F (37.1 C) (Oral)  Wt 126 lb 9.6 oz (57.425 kg)  SpO2 96%  General:  alert, cooperative, appears stated age and no distress  Affect/Behavior:  full facial expressions, good grooming and good insight none      Assessment:    Anxiety Disorder - improving    Plan:    Medications: Lexapro, Wellbutrin and Xanax. Handouts describing disease, natural history, and treatment were given to the patient. Instructed patient to contact office or on-call physician promptly should condition worsen or any new symptoms appear and provided on-call telephone numbers. IF THE PATIENT HAS ANY SUICIDAL OR HOMICIDAL IDEATIONS, CALL THE OFFICE, DISCUSS WITH A SUPPORT MEMBER, OR GO TO THE ER IMMEDIATELY. Patient was agreeable with this plan. Follow up: 6 months.

## 2012-01-13 ENCOUNTER — Encounter: Payer: Self-pay | Admitting: Family Medicine

## 2012-01-13 ENCOUNTER — Ambulatory Visit (INDEPENDENT_AMBULATORY_CARE_PROVIDER_SITE_OTHER): Payer: BC Managed Care – PPO | Admitting: Family Medicine

## 2012-01-13 VITALS — BP 100/60 | HR 91 | Temp 98.6°F | Wt 125.2 lb

## 2012-01-13 DIAGNOSIS — J329 Chronic sinusitis, unspecified: Secondary | ICD-10-CM

## 2012-01-13 DIAGNOSIS — R05 Cough: Secondary | ICD-10-CM

## 2012-01-13 MED ORDER — BECLOMETHASONE DIPROPIONATE 80 MCG/ACT NA AERS: 2.0000 | INHALATION_SPRAY | Freq: Every day | NASAL | Status: DC

## 2012-01-13 MED ORDER — GUAIFENESIN-CODEINE 100-10 MG/5ML PO SYRP: ORAL_SOLUTION | ORAL | Status: DC

## 2012-01-13 MED ORDER — AMOXICILLIN-POT CLAVULANATE 875-125 MG PO TABS: 1.0000 | ORAL_TABLET | Freq: Two times a day (BID) | ORAL | Status: AC

## 2012-01-13 NOTE — Patient Instructions (Signed)

## 2012-01-13 NOTE — Progress Notes (Signed)
  Subjective:     Anaira Seay is a 31 y.o. female who presents for evaluation of sinus pain. Symptoms include: congestion, cough, fevers, nasal congestion and sinus pressure. Onset of symptoms was 1 week ago. Symptoms have been gradually worsening since that time. Past history is significant for no history of pneumonia or bronchitis. Patient is a non-smoker.  The following portions of the patient's history were reviewed and updated as appropriate: allergies, current medications, past family history, past medical history, past social history, past surgical history and problem list.  Review of Systems Pertinent items are noted in HPI.   Objective:    BP 100/60  Pulse 91  Temp(Src) 98.6 F (37 C) (Oral)  Wt 125 lb 3.2 oz (56.79 kg)  SpO2 98% General appearance: alert, cooperative, appears stated age and no distress Ears: normal TM's and external ear canals both ears Nose: green discharge, moderate congestion, turbinates red, swollen, sinus tenderness bilateral Throat: abnormal findings: pnd Neck: mild anterior cervical adenopathy, supple, symmetrical, trachea midline and thyroid not enlarged, symmetric, no tenderness/mass/nodules Lungs: clear to auscultation bilaterally Extremities: extremities normal, atraumatic, no cyanosis or edema    Assessment:    Acute bacterial sinusitis.    Plan:    Nasal steroids per medication orders. Antihistamines per medication orders. Augmentin per medication orders. Follow up in a few days or as needed.

## 2012-01-23 ENCOUNTER — Other Ambulatory Visit: Payer: Self-pay | Admitting: Family Medicine

## 2012-01-23 DIAGNOSIS — F419 Anxiety disorder, unspecified: Secondary | ICD-10-CM

## 2012-01-23 MED ORDER — ALPRAZOLAM 0.25 MG PO TABS: 0.2500 mg | ORAL_TABLET | Freq: Three times a day (TID) | ORAL | Status: DC | PRN

## 2012-01-23 NOTE — Telephone Encounter (Signed)
Last seen 01/13/12 and filled 12/21/11 # 30. Please advise    KP

## 2012-01-23 NOTE — Telephone Encounter (Signed)
rx refill for     ALPRAZolam (XANAX) 0.25 MG tablet      Send to [facility]

## 2012-01-24 MED ORDER — ALPRAZOLAM 0.25 MG PO TABS: 0.2500 mg | ORAL_TABLET | Freq: Three times a day (TID) | ORAL | Status: DC | PRN

## 2012-01-24 NOTE — Telephone Encounter (Signed)
Addended by: Arnette Norris on: 01/24/2012 10:36 AM   Modules accepted: Orders

## 2012-04-23 ENCOUNTER — Telehealth: Payer: Self-pay | Admitting: Family Medicine

## 2012-04-23 DIAGNOSIS — F419 Anxiety disorder, unspecified: Secondary | ICD-10-CM

## 2012-04-23 NOTE — Telephone Encounter (Signed)
Refill: Alprazolam 0.25mg  tab.

## 2012-04-24 MED ORDER — ALPRAZOLAM 0.25 MG PO TABS: 0.2500 mg | ORAL_TABLET | Freq: Three times a day (TID) | ORAL | Status: DC | PRN

## 2012-04-24 NOTE — Telephone Encounter (Signed)
Refill x1---  Ov due 05/2012

## 2012-04-24 NOTE — Telephone Encounter (Signed)
Rx faxed.    KP 

## 2012-04-24 NOTE — Telephone Encounter (Signed)
Last seen 01/13/12 and filled 01/24/12 #30. Please advise     KP

## 2012-05-29 ENCOUNTER — Telehealth: Payer: Self-pay | Admitting: Family Medicine

## 2012-05-29 DIAGNOSIS — F419 Anxiety disorder, unspecified: Secondary | ICD-10-CM

## 2012-05-29 MED ORDER — ALPRAZOLAM 0.25 MG PO TABS: 0.2500 mg | ORAL_TABLET | Freq: Three times a day (TID) | ORAL | Status: DC | PRN

## 2012-05-29 NOTE — Telephone Encounter (Signed)
Refill x1 

## 2012-05-29 NOTE — Telephone Encounter (Signed)
Last seen 01/13/12 and filled 04/24/12 # 30. Please advise    KP

## 2012-05-29 NOTE — Telephone Encounter (Signed)
Refill: Alprazolam 0.25mg  tab. Last fill 04-24-12

## 2012-06-25 ENCOUNTER — Telehealth: Payer: Self-pay | Admitting: Family Medicine

## 2012-06-25 DIAGNOSIS — F419 Anxiety disorder, unspecified: Secondary | ICD-10-CM

## 2012-06-25 MED ORDER — ALPRAZOLAM 0.25 MG PO TABS: 0.2500 mg | ORAL_TABLET | Freq: Three times a day (TID) | ORAL | Status: DC | PRN

## 2012-06-25 NOTE — Telephone Encounter (Signed)
Refill x1--  Should have ov

## 2012-06-25 NOTE — Telephone Encounter (Signed)
Refill: Alprazolam 0.25mg  tab. Last fill 05-29-12

## 2012-06-25 NOTE — Telephone Encounter (Signed)
Last seen 01/13/12 and filled 05/29/12 # 30. Please advise     KP

## 2012-08-14 ENCOUNTER — Ambulatory Visit (INDEPENDENT_AMBULATORY_CARE_PROVIDER_SITE_OTHER): Payer: BC Managed Care – PPO | Admitting: Family Medicine

## 2012-08-14 ENCOUNTER — Encounter: Payer: Self-pay | Admitting: Family Medicine

## 2012-08-14 VITALS — BP 112/70 | HR 58 | Temp 98.1°F | Wt 133.6 lb

## 2012-08-14 DIAGNOSIS — Z8639 Personal history of other endocrine, nutritional and metabolic disease: Secondary | ICD-10-CM

## 2012-08-14 DIAGNOSIS — R635 Abnormal weight gain: Secondary | ICD-10-CM

## 2012-08-14 DIAGNOSIS — D649 Anemia, unspecified: Secondary | ICD-10-CM

## 2012-08-14 LAB — CBC WITH DIFFERENTIAL/PLATELET
Basophils Relative: 0.7 % (ref 0.0–3.0)
Eosinophils Relative: 2.7 % (ref 0.0–5.0)
HCT: 43 % (ref 36.0–46.0)
Lymphocytes Relative: 36.9 % (ref 12.0–46.0)
Lymphs Abs: 1.9 10*3/uL (ref 0.7–4.0)
MCHC: 32.5 g/dL (ref 30.0–36.0)
MCV: 99.3 fl (ref 78.0–100.0)
Monocytes Absolute: 0.4 10*3/uL (ref 0.1–1.0)
Monocytes Relative: 6.9 % (ref 3.0–12.0)
Neutro Abs: 2.7 10*3/uL (ref 1.4–7.7)
Neutrophils Relative %: 52.8 % (ref 43.0–77.0)
Platelets: 204 10*3/uL (ref 150.0–400.0)
RBC: 4.33 Mil/uL (ref 3.87–5.11)
WBC: 5.2 10*3/uL (ref 4.5–10.5)

## 2012-08-14 NOTE — Progress Notes (Signed)
  Subjective:    Patient ID: Erica Brewer, female    DOB: 1981-02-23, 31 y.o.   MRN: [id number] HPI Pt here to discuss 5 lb weight gain in the last month.  She stopped all her meds in August.  She exercises 3-4 x a week--cycling.   She eats healthy.   She is fatigued and c/o dry skin /hair/ lips.  She also c/o raspy voice.     Review of Systems    as above Objective:   Physical Exam  Constitutional: She appears well-developed and well-nourished.  Psychiatric: She has a normal mood and affect. Her behavior is normal. Judgment and thought content normal.          Assessment & Plan:   Subjective:     Erica Brewer is a 31 y.o. female who presents for evaluation of fatigue. Symptoms began several months ago. The patient feels the fatigue began with: change in hair texture, excessive menstrual bleeding and a significant change in weight. Symptoms of her fatigue have been general malaise. Patient describes the following psychological symptoms: none. Patient denies cold intolerance, constipation, exercise intolerance, fever, GI blood loss, symptoms of arthritis, unusual rashes and witnessed or suspected sleep apnea. Symptoms have gradually worsened. Symptom severity: symptoms bothersome, but easily able to carry out all usual work/school/family activities. Previous visits for this problem: none.   The following portions of the patient's history were reviewed and updated as appropriate: allergies, current medications, past family history, past medical history, past social history, past surgical history and problem list.  Review of Systems Pertinent items are noted in HPI.    Objective:    BP 112/70  Pulse 58  Temp 98.1 F (36.7 C) (Oral)  Wt 133 lb 9.6 oz (60.601 kg)  SpO2 99% General appearance: alert, cooperative, appears stated age and no distress Neck: no adenopathy, supple, symmetrical, trachea midline and thyroid not enlarged, symmetric, no  tenderness/mass/nodules Lungs: clear to auscultation bilaterally Heart: S1, S2 normal    Assessment:    Fatigue, organic cause likely.  Differential diagnoses includes: anemia due to heavy periods and hypothyroidism.    Plan:    Discussed diagnosis with patient. Reassured that serious underlying cause for the fatigue is very unlikely. Discussed lifestyle modification as means of resolving problem. Training modifications discussed. See orders for lab evaluation. pt stopped antidepressants on her own

## 2012-08-14 NOTE — Patient Instructions (Signed)
Thyroid Diseases Your thyroid is a butterfly-shaped gland in your neck. It is located just above your collarbone. It is one of your endocrine glands, which make hormones. The thyroid helps set your metabolism. Metabolism is how your body gets energy from the foods you eat.  Millions of people have thyroid diseases. Women experience thyroid problems more often than men. In fact, overactive thyroid problems (hyperthyroidism) occur in 1% of all women. If you have a thyroid disease, your body may use energy more slowly or quickly than it should.  Thyroid problems also include an immune disease where your body reacts against your thyroid gland (called thyroiditis). A different problem involves lumps and bumps (called nodules) that develop in the gland. The nodules are usually, but not always, noncancerous. THE MOST COMMON THYROID PROBLEMS AND CAUSES ARE DISCUSSED BELOW There are many causes for thyroid problems. Treatment depends upon the exact diagnosis and includes trying to reset your body's metabolism to a normal rate. Hyperthyroidism Too much thyroid hormone from an overactive thyroid gland is called hyperthyroidism. In hyperthyroidism, the body's metabolism speeds up. One of the most frequent forms of hyperthyroidism is known as Graves' disease. Graves' disease tends to run in families. Although Graves' is thought to be caused by a problem with the immune system, the exact nature of the genetic problem is unknown. Hypothyroidism Too little thyroid hormone from an underactive thyroid gland is called hypothyroidism. In hypothyroidism, the body's metabolism is slowed. Several things can cause this condition. Most causes affect the thyroid gland directly and hurt its ability to make enough hormone.  Rarely, there may be a pituitary gland tumor (located near the base of the brain). The tumor can block the pituitary from producing thyroid-stimulating hormone (TSH). Your body makes TSH to stimulate the thyroid  to work properly. If the pituitary does not make enough TSH, the thyroid fails to make enough hormones needed for good health. Whether the problem is caused by thyroid conditions or by the pituitary gland, the result is that the thyroid is not making enough hormones. Hypothyroidism causes many physical and mental processes to become sluggish. The body consumes less oxygen and produces less body heat. Thyroid Nodules A thyroid nodule is a small swelling or lump in the thyroid gland. They are common. These nodules represent either a growth of thyroid tissue or a fluid-filled cyst. Both form a lump in the thyroid gland. Almost half of all people will have tiny thyroid nodules at some point in their lives. Typically, these are not noticeable until they become large and affect normal thyroid size. Larger nodules that are greater than a half inch across (about 1 centimeter) occur in about 5 percent of people. Although most nodules are not cancerous, people who have them should seek medical care to rule out cancer. Also, some thyroid nodules may produce too much thyroid hormone or become too large. Large nodules or a large gland can interfere with breathing or swallowing or may cause neck discomfort. Other problems Other thyroid problems include cancer and thyroiditis. Thyroiditis is a malfunction of the body's immune system. Normally, the immune system works to defend the body against infection and other problems. When the immune system is not working properly, it may mistakenly attack normal cells, tissues, and organs. Examples of autoimmune diseases are Hashimoto's thyroiditis (which causes low thyroid function) and Graves' disease (which causes excess thyroid function). SYMPTOMS  Symptoms vary greatly depending upon the exact type of problem with the thyroid. Hyperthyroidism-is when your thyroid is too   active and makes more thyroid hormone than your body needs. The most common cause is Graves' Disease. Too  much thyroid hormone can cause some or all of the following symptoms:  Anxiety.   Irritability.   Difficulty sleeping.   Fatigue.   A rapid or irregular heartbeat.   A fine tremor of your hands or fingers.   An increase in perspiration.   Sensitivity to heat.   Weight loss, despite normal food intake.   Brittle hair.   Enlargement of your thyroid gland (goiter).   Light menstrual periods.   Frequent bowel movements.  Graves' disease can specifically cause eye and skin problems. The skin problems involve reddening and swelling of the skin, often on your shins and on the top of your feet. Eye problems can include the following:  Excess tearing and sensation of grit or sand in either or both eyes.   Reddened or inflamed eyes.   Widening of the space between your eyelids.   Swelling of the lids and tissues around the eyes.   Light sensitivity.   Ulcers on the cornea.   Double vision.   Limited eye movements.   Blurred or reduced vision.  Hypothyroidism- is when your thyroid gland is not active enough. This is more common than hyperthyroidism. Symptoms can vary a lot depending of the severity of the hormone deficiency. Symptoms may develop over a long period of time and can include several of the following:  Fatigue.   Sluggishness.   Increased sensitivity to cold.   Constipation.   Pale, dry skin.   A puffy face.   Hoarse voice.   High blood cholesterol level.   Unexplained weight gain.   Muscle aches, tenderness and stiffness.   Pain, stiffness or swelling in your joints.   Muscle weakness.   Heavier than normal menstrual periods.   Brittle fingernails and hair.   Depression.  Thyroid Nodules - most do not cause signs or symptoms. Occasionally, some may become so large that you can feel or even see the swelling at the base of your neck. You may realize a lump or swelling is there when you are shaving or putting on makeup. Men might become  aware of a nodule when shirt collars suddenly feel too tight. Some nodules produce too much thyroid hormone. This can produce the same symptoms as hyperthyroidism (see above). Thyroid nodules are seldom cancerous. However, a nodule is more likely to be malignant (cancerous) if it:  Grows quickly or feels hard.   Causes you to become hoarse or to have trouble swallowing or breathing.   Causes enlarged lymph nodes under your jaw or in your neck.  DIAGNOSIS  Because there are so many possible thyroid conditions, your caregiver may ask for a number of tests. They will do this in order to narrow down the exact diagnosis. These tests can include:  Blood and antibody tests.   Special thyroid scans using small, safe amounts of radioactive iodine.   Ultrasound of the thyroid gland (particularly if there is a nodule or lump).   Biopsy. This is usually done with a special needle. A needle biopsy is a procedure to obtain a sample of cells from the thyroid. The tissue will be tested in a lab and examined under a microscope.  TREATMENT  Treatment depends on the exact diagnosis. Hyperthyroidism  Beta-blockers help relieve many of the symptoms.   Anti-thyroid medications prevent the thyroid from making excess hormones.   Radioactive iodine treatment can destroy overactive thyroid   cells. The iodine can permanently decrease the amount of hormone produced.   Surgery to remove the thyroid gland.   Treatments for eye problems that come from Graves' disease also include medications and special eye surgery, if felt to be appropriate.  Hypothyroidism Thyroid replacement with levothyroxine is the mainstay of treatment. Treatment with thyroid replacement is usually lifelong and will require monitoring and adjustment from time to time. Thyroid Nodules  Watchful waiting. If a small nodule causes no symptoms or signs of cancer on biopsy, then no treatment may be chosen at first. Re-exam and re-checking blood  tests would be the recommended follow-up.   Anti-thyroid medications or radioactive iodine treatment may be recommended if the nodules produce too much thyroid hormone (see Treatment for Hyperthyroidism above).   Alcohol ablation. Injections of small amounts of ethyl alcohol (ethanol) can cause a non-cancerous nodule to shrink in size.   Surgery (see Treatment for Hyperthyroidism above).  HOME CARE INSTRUCTIONS   Take medications as instructed.   Follow through on recommended testing.  SEEK MEDICAL CARE IF:   You feel that you are developing symptoms of Hyperthyroidism or Hypothyroidism as described above.   You develop a new lump/nodule in the neck/thyroid area that you had not noticed before.   You feel that you are having side effects from medicines prescribed.   You develop trouble breathing or swallowing.  SEEK IMMEDIATE MEDICAL CARE IF:   You develop a fever of 102 F (38.9 C) or higher.   You develop severe sweating.   You develop palpitations and/or rapid heart beat.   You develop shortness of breath.   You develop nausea and vomiting.   You develop extreme shakiness.   You develop agitation.   You develop lightheadedness or have a fainting episode.  Document Released: 09/04/2007 Document Revised: 10/27/2011 Document Reviewed: 09/04/2007 [employer] Patient Information 2012 [employer]. 

## 2012-08-15 LAB — IBC PANEL
Iron: 60 ug/dL (ref 42–145)
Saturation Ratios: 12.6 % — ABNORMAL LOW (ref 20.0–50.0)
Transferrin: 341.3 mg/dL (ref 212.0–360.0)

## 2012-08-15 LAB — T3, FREE: T3, Free: 2.5 pg/mL (ref 2.3–4.2)

## 2012-08-15 LAB — THYROID ANTIBODIES: Thyroperoxidase Ab SerPl-aCnc: 136 IU/mL — ABNORMAL HIGH (ref <35.0)

## 2012-08-15 LAB — T4, FREE: Free T4: 0.9 ng/dL (ref 0.60–1.60)

## 2012-08-15 LAB — VITAMIN B12: Vitamin B-12: 438 pg/mL (ref 211–911)

## 2012-08-15 LAB — FERRITIN: Ferritin: 6.8 ng/mL — ABNORMAL LOW (ref 10.0–291.0)

## 2012-08-20 ENCOUNTER — Other Ambulatory Visit: Payer: Self-pay | Admitting: Family Medicine

## 2012-08-20 ENCOUNTER — Telehealth: Payer: Self-pay

## 2012-08-20 DIAGNOSIS — R7989 Other specified abnormal findings of blood chemistry: Secondary | ICD-10-CM

## 2012-08-20 DIAGNOSIS — L659 Nonscarring hair loss, unspecified: Secondary | ICD-10-CM

## 2012-08-20 NOTE — Telephone Encounter (Signed)
Pt called in concerned about lab results. Plz advise    MW

## 2012-08-20 NOTE — Telephone Encounter (Signed)
tsh--- not back Everything else neg except thyroid antibody-- I need TSH----then we may put referral in for endo

## 2012-08-20 NOTE — Telephone Encounter (Signed)
msg left to call the office     KP 

## 2012-08-21 ENCOUNTER — Telehealth: Payer: Self-pay

## 2012-08-21 NOTE — Telephone Encounter (Signed)
Spoke with pt concerning lab results. Advised pt :  tsh--- not back  Everything else neg except thyroid antibody-- I need TSH----then we may put referral in for endo  Pt stated understanding.     MW

## 2012-08-22 ENCOUNTER — Telehealth: Payer: Self-pay

## 2012-08-22 NOTE — Telephone Encounter (Signed)
Spoke to [facility]. Advised them per pt results TSH 2.09 per Sheena.     MW

## 2012-08-22 NOTE — Telephone Encounter (Signed)
Released on Mychart--TSH still pending, Zenon Mayo will check the status     KP

## 2012-08-22 NOTE — Telephone Encounter (Signed)
[facility] called for pt TSH. Advised her and she requested results faxed over.  Put fax info on lab results hard copy. Plz fax after doctor is finish with results.     MW

## 2012-10-04 ENCOUNTER — Encounter: Payer: Self-pay | Admitting: Family Medicine

## 2012-10-04 ENCOUNTER — Ambulatory Visit (INDEPENDENT_AMBULATORY_CARE_PROVIDER_SITE_OTHER): Payer: BC Managed Care – PPO | Admitting: Family Medicine

## 2012-10-04 VITALS — BP 110/70 | HR 65 | Temp 98.1°F | Wt 133.4 lb

## 2012-10-04 DIAGNOSIS — L309 Dermatitis, unspecified: Secondary | ICD-10-CM

## 2012-10-04 DIAGNOSIS — L259 Unspecified contact dermatitis, unspecified cause: Secondary | ICD-10-CM

## 2012-10-04 MED ORDER — PREDNISONE 10 MG PO TABS: ORAL_TABLET | ORAL | Status: DC

## 2012-10-04 MED ORDER — CLOTRIMAZOLE-BETAMETHASONE 1-0.05 % EX LOTN: TOPICAL_LOTION | Freq: Two times a day (BID) | CUTANEOUS | Status: DC

## 2012-10-04 MED ORDER — METHYLPREDNISOLONE ACETATE 80 MG/ML IJ SUSP
80.0000 mg | Freq: Once | INTRAMUSCULAR | Status: AC
  Administered 2012-10-04: 80 mg via INTRAMUSCULAR

## 2012-10-04 NOTE — Patient Instructions (Signed)

## 2012-10-04 NOTE — Addendum Note (Signed)
Addended by: Arnette Norris on: 10/04/2012 12:13 PM   Modules accepted: Orders

## 2012-10-04 NOTE — Progress Notes (Signed)
  Subjective:     Erica Brewer is a 31 y.o. female who presents for evaluation of a rash involving the face. Rash started a few days ago. Lesions are pink, and raised in texture. Rash has changed over time. Rash is pruritic and hot. Associated symptoms: none. Patient denies: abdominal pain, arthralgia, congestion, cough, crankiness, decrease in appetite, decrease in energy level, fever, headache, irritability, myalgia, nausea, sore throat and vomiting. Patient has not had contacts with similar rash. Patient has not had new exposures (soaps, lotions, laundry detergents, foods, medications, plants, insects or animals).  The following portions of the patient's history were reviewed and updated as appropriate: allergies, current medications, past family history, past medical history, past social history, past surgical history and problem list.  Review of Systems Pertinent items are noted in HPI.    Objective:    BP 110/70  Pulse 65  Temp 98.1 F (36.7 C) (Oral)  Wt 133 lb 6.4 oz (60.51 kg)  SpO2 98% General:  alert, cooperative, appears stated age and no distress  Skin:  erythema noted on face and rash noted on trunk     Assessment:    contact dermatitis: unknown    Plan:    Medications: lotrisone and steroids: pred taper, depo medrol. Written patient instruction given. Follow up in a few days. --prn

## 2013-01-05 ENCOUNTER — Other Ambulatory Visit: Payer: Self-pay

## 2013-09-26 ENCOUNTER — Other Ambulatory Visit: Payer: Self-pay

## 2013-12-20 ENCOUNTER — Encounter: Payer: Self-pay | Admitting: Family Medicine

## 2013-12-20 ENCOUNTER — Ambulatory Visit (INDEPENDENT_AMBULATORY_CARE_PROVIDER_SITE_OTHER): Payer: BC Managed Care – PPO | Admitting: Family Medicine

## 2013-12-20 VITALS — BP 110/70 | HR 62 | Temp 98.2°F | Resp 16 | Wt 133.1 lb

## 2013-12-20 DIAGNOSIS — J029 Acute pharyngitis, unspecified: Secondary | ICD-10-CM

## 2013-12-20 MED ORDER — AMOXICILLIN 875 MG PO TABS
875.0000 mg | ORAL_TABLET | Freq: Two times a day (BID) | ORAL | Status: DC
Start: 2013-12-20 — End: 2015-01-23

## 2013-12-20 NOTE — Addendum Note (Signed)
Addended by: Silvio PateHOMPSON, Tekia Waterbury D on: 12/20/2013 03:15 PM   Modules accepted: Orders

## 2013-12-20 NOTE — Progress Notes (Signed)
   Subjective:    Patient ID: Erica Brewer, female    DOB: 1981/01/01, 33 y.o.   MRN: [id number]  HPI Sore throat- sxs started yesterday.  No fevers.  Both girls w/ strep throat.  No cough.  + sinus pressure but no congestion.  No nausea.   Review of Systems For ROS see HPI     Objective:   Physical Exam  Vitals reviewed. Constitutional: She appears well-developed and well-nourished. No distress.  HENT:  Mouth/Throat: Oropharynx is clear and moist. No oropharyngeal exudate.  Neck: Normal range of motion. Neck supple.  Pulmonary/Chest: Effort normal and breath sounds normal. No respiratory distress. She has no wheezes. She has no rales.          Assessment & Plan:

## 2013-12-20 NOTE — Addendum Note (Signed)
Addended by: Jackson LatinoYLER, JESSICA L on: 12/20/2013 02:48 PM   Modules accepted: Orders

## 2013-12-20 NOTE — Progress Notes (Signed)
Pre visit review using our clinic review tool, if applicable. No additional management support is needed unless otherwise documented below in the visit note. 

## 2013-12-20 NOTE — Assessment & Plan Note (Signed)
New.  Pt's daughter's both w/ strep.  Rapid test negative.  Send cx.  Script sent for Amox in case pt develops fever or worsening sore throat.

## 2013-12-20 NOTE — Patient Instructions (Signed)
Start the Amoxicillin if your sore throat worsens or you develop a fever Drink plenty of fluids Ibuprofen for throat pain REST! We'll call you with your culture results Hang in there!!!

## 2013-12-22 LAB — CULTURE, GROUP A STREP: ORGANISM ID, BACTERIA: NORMAL

## 2014-10-17 ENCOUNTER — Ambulatory Visit (INDEPENDENT_AMBULATORY_CARE_PROVIDER_SITE_OTHER): Payer: BLUE CROSS/BLUE SHIELD | Admitting: Physician Assistant

## 2014-10-17 ENCOUNTER — Encounter: Payer: Self-pay | Admitting: Physician Assistant

## 2014-10-17 ENCOUNTER — Other Ambulatory Visit: Payer: BC Managed Care – PPO

## 2014-10-17 VITALS — BP 120/60 | HR 65 | Temp 97.7°F | Wt 135.0 lb

## 2014-10-17 DIAGNOSIS — B9789 Other viral agents as the cause of diseases classified elsewhere: Secondary | ICD-10-CM

## 2014-10-17 DIAGNOSIS — J028 Acute pharyngitis due to other specified organisms: Principal | ICD-10-CM

## 2014-10-17 DIAGNOSIS — J029 Acute pharyngitis, unspecified: Secondary | ICD-10-CM

## 2014-10-17 NOTE — Progress Notes (Signed)
Pre visit review using our clinic review tool, if applicable. No additional management support is needed unless otherwise documented below in the visit note. 

## 2014-10-17 NOTE — Progress Notes (Signed)
    Patient presents to clinic today c/o sore throat x 5 days.  Patient endorses achiness, fatigue..  Denies chest congestion, shortness of breath. Denies recent travel.. Patient is s/p tonsillectomy.  Daughter with similar symptoms that have resolved.  Past Medical History  Diagnosis Date  . Anxiety   . Thyroid disease     Hypothyroidism, in pregnancy    Current Outpatient Prescriptions on File Prior to Visit  Medication Sig Dispense Refill  . ARMOUR THYROID PO Take 81.25 mg by mouth 2 (two) times daily.    Marland Kitchen. amoxicillin (AMOXIL) 875 MG tablet Take 1 tablet (875 mg total) by mouth 2 (two) times daily. (Patient not taking: Reported on 10/17/2014) 20 tablet 0   No current facility-administered medications on file prior to visit.    No Known Allergies  Family History  Problem Relation Age of Onset  . Coronary artery disease    . Hypertension    . Hyperlipidemia    . Colon cancer Paternal Grandfather   . Depression Father   . Depression Paternal Aunt     History   Social History  . Marital Status: Married    Spouse Name: N/A    Number of Children: N/A  . Years of Education: N/A   Occupational History  . housewife     Social History Main Topics  . Smoking status: Never Smoker   . Smokeless tobacco: Never Used  . Alcohol Use: 6.0 oz/week    10 Glasses of wine per week  . Drug Use: None  . Sexual Activity:    Partners: Male   Other Topics Concern  . None   Social History Narrative   Review of Systems - See HPI.  All other ROS are negative.  BP 120/60 mmHg  Pulse 65  Temp(Src) 97.7 F (36.5 C) (Oral)  Wt 135 lb (61.236 kg)  SpO2 97%  Physical Exam  Constitutional: She is oriented to person, place, and time and well-developed, well-nourished, and in no distress.  HENT:  Head: Normocephalic and atraumatic.  Right Ear: Tympanic membrane, external ear and ear canal normal.  Left Ear: Tympanic membrane, external ear and ear canal normal.  Nose: Nose normal.    Mouth/Throat: Uvula is midline and mucous membranes are normal. Posterior oropharyngeal erythema present. No oropharyngeal exudate or posterior oropharyngeal edema.  Eyes: Conjunctivae are normal. Pupils are equal, round, and reactive to light.  Neck: Neck supple.  Cardiovascular: Normal rate, regular rhythm, normal heart sounds and intact distal pulses.   Pulmonary/Chest: Effort normal and breath sounds normal. No respiratory distress. She has no wheezes. She has no rales. She exhibits no tenderness.  Lymphadenopathy:    She has no cervical adenopathy.  Neurological: She is alert and oriented to person, place, and time.  Skin: Skin is warm and dry. No rash noted.  Psychiatric: Affect normal.  Vitals reviewed.  Assessment/Plan: Viral sore throat Rapid strep negative. Will send for culture.  Afebrile and s/p tonsillectomy.  Supportive measures -- increase fluids, rest, alternate tylenol and ibuprofen for pain, soft foods, humidifier in bedroom and salt-water gargles -- discussed.  Return precautions discussed with patient.  Handout given.

## 2014-10-17 NOTE — Assessment & Plan Note (Signed)
Rapid strep negative. Will send for culture.  Afebrile and s/p tonsillectomy.  Supportive measures -- increase fluids, rest, alternate tylenol and ibuprofen for pain, soft foods, humidifier in bedroom and salt-water gargles -- discussed.  Return precautions discussed with patient.  Handout given.

## 2014-10-17 NOTE — Patient Instructions (Signed)
Please stay well hydrated.  Alternate between tylenol and ibuprofen for sore throat.  Salt-water gargles will also be beneficial.  Place a humidifier in the bedroom. Symptoms should continue to improve. We will call you with your culture results.  Sore Throat A sore throat is a painful, burning, sore, or scratchy feeling of the throat. There may be pain or tenderness when swallowing or talking. You may have other symptoms with a sore throat. These include coughing, sneezing, fever, or a swollen neck. A sore throat is often the first sign of another sickness. These sicknesses may include a cold, flu, strep throat, or an infection called mono. Most sore throats go away without medical treatment.  HOME CARE   Only take medicine as told by your doctor.  Drink enough fluids to keep your pee (urine) clear or pale yellow.  Rest as needed.  Try using throat sprays, lozenges, or suck on hard candy (if older than 4 years or as told).  Sip warm liquids, such as broth, herbal tea, or warm water with honey. Try sucking on frozen ice pops or drinking cold liquids.  Rinse the mouth (gargle) with salt water. Mix 1 teaspoon salt with 8 ounces of water.  Do not smoke. Avoid being around others when they are smoking.  Put a humidifier in your bedroom at night to moisten the air. You can also turn on a hot shower and sit in the bathroom for 5-10 minutes. Be sure the bathroom door is closed. GET HELP RIGHT AWAY IF:   You have trouble breathing.  You cannot swallow fluids, soft foods, or your spit (saliva).  You have more puffiness (swelling) in the throat.  Your sore throat does not get better in 7 days.  You feel sick to your stomach (nauseous) and throw up (vomit).  You have a fever or lasting symptoms for more than 2-3 days.  You have a fever and your symptoms suddenly get worse. MAKE SURE YOU:   Understand these instructions.  Will watch your condition.  Will get help right away if you are  not doing well or get worse. Document Released: 08/16/2008 Document Revised: 08/01/2012 Document Reviewed: 07/15/2012 [facility] Patient Information 2015 [place], [place]. This information is not intended to replace advice given to you by your health care provider. Make sure you discuss any questions you have with your health care provider.

## 2014-10-19 LAB — CULTURE, GROUP A STREP: ORGANISM ID, BACTERIA: NORMAL

## 2014-10-21 ENCOUNTER — Other Ambulatory Visit: Payer: Self-pay | Admitting: Family Medicine

## 2014-10-21 ENCOUNTER — Encounter: Payer: Self-pay | Admitting: Family Medicine

## 2014-10-21 DIAGNOSIS — F430 Acute stress reaction: Secondary | ICD-10-CM

## 2014-10-21 MED ORDER — ALPRAZOLAM 0.25 MG PO TABS: 0.2500 mg | ORAL_TABLET | Freq: Three times a day (TID) | ORAL | Status: DC | PRN

## 2014-10-21 NOTE — Telephone Encounter (Signed)
Xanax was printed

## 2014-10-21 NOTE — Telephone Encounter (Signed)
Xanax 0.25 mg #30  1 po tid prn  0 refills--printed

## 2015-01-23 ENCOUNTER — Encounter: Payer: Self-pay | Admitting: Primary Care

## 2015-01-23 ENCOUNTER — Ambulatory Visit (INDEPENDENT_AMBULATORY_CARE_PROVIDER_SITE_OTHER): Payer: BLUE CROSS/BLUE SHIELD | Admitting: Primary Care

## 2015-01-23 VITALS — BP 100/70 | HR 64 | Temp 97.4°F | Resp 16

## 2015-01-23 DIAGNOSIS — H6690 Otitis media, unspecified, unspecified ear: Secondary | ICD-10-CM | POA: Insufficient documentation

## 2015-01-23 DIAGNOSIS — H6691 Otitis media, unspecified, right ear: Secondary | ICD-10-CM | POA: Diagnosis not present

## 2015-01-23 MED ORDER — AMOXICILLIN 500 MG PO CAPS: 500.0000 mg | ORAL_CAPSULE | Freq: Three times a day (TID) | ORAL | Status: AC

## 2015-01-23 NOTE — Assessment & Plan Note (Signed)
Right otitis media. RX for amoxicillin. Continue Neti Pot rinses, ibuprofen for pain.

## 2015-01-23 NOTE — Progress Notes (Addendum)
Subjective:    Patient ID: Erica Brewer, female    DOB: [birth year], 34 y.o.   MRN: [id number]  HPI  Ms. Erica Brewer is a 34 year old female who presents today with a chief complaint of pain, pressure, and ringing to bilateral ears x 5 days with pressure to maxillary sinuses. She reports a sore throat, cough, and post nasal drip one week ago which has improved. Denies rhinorrhea, cough, nasal congestion, and sore throat today. She has been using Neti Pot rinses for the last week with return of clear fluid (no discolored mucous return) which has made no difference. She's also taking ibuprofen which provides temporary relief of pain. Robitussin for cough with help.    Review of Systems  Constitutional: Positive for fatigue. Negative for fever and chills.  HENT: Positive for sinus pressure. Negative for rhinorrhea and sore throat.        Prior cough that has improved.  Respiratory: Negative for cough and shortness of breath.        Cough one week ago that is resolving.  Cardiovascular: Negative for chest pain.  Gastrointestinal: Negative for nausea and vomiting.  Musculoskeletal: Negative for myalgias.  Neurological: Positive for dizziness.       Minor dizziness intemittently   Past Medical History  Diagnosis Date  . Anxiety   . Thyroid disease     Hypothyroidism, in pregnancy    History   Social History  . Marital Status: Married    Spouse Name: N/A  . Number of Children: N/A  . Years of Education: N/A   Occupational History  . housewife     Social History Main Topics  . Smoking status: Never Smoker   . Smokeless tobacco: Never Used  . Alcohol Use: 6.0 oz/week    10 Glasses of wine per week  . Drug Use: Not on file  . Sexual Activity:    Partners: Male   Other Topics Concern  . Not on file   Social History Narrative    Past Surgical History  Procedure Laterality Date  . Strabismus surgery  05/2009    Left Eye-- Dr. Maple HudsonYoung     Family History    Problem Relation Age of Onset  . Coronary artery disease    . Hypertension    . Hyperlipidemia    . Colon cancer Paternal Grandfather   . Depression Father   . Depression Paternal Aunt     No Known Allergies  Current Outpatient Prescriptions on File Prior to Visit  Medication Sig Dispense Refill  . ARMOUR THYROID PO Take 81.25 mg by mouth 2 (two) times daily.     No current facility-administered medications on file prior to visit.    BP 100/70 mmHg  Pulse 64  Temp(Src) 97.4 F (36.3 C) (Oral)  Resp 16  SpO2 99%  LMP 01/20/2015       Objective:   Physical Exam  Constitutional: She is oriented to person, place, and time. She appears well-developed.  HENT:  Head: Normocephalic.  Right Ear: There is tenderness. Tympanic membrane is injected and erythematous.  Left Ear: There is tenderness. Tympanic membrane is erythematous.  Nose: Nose normal.  Mildy tender to left maxillary sinus.  Eyes: Conjunctivae are normal.  Neck: Neck supple.  Cardiovascular: Normal rate and regular rhythm.   Pulmonary/Chest: Effort normal and breath sounds normal.  Lymphadenopathy:    She has no cervical adenopathy.  Neurological: She is alert and oriented to person, place, and time.  Skin: Skin is  warm and dry.  Psychiatric: She has a normal mood and affect.          Assessment & Plan:

## 2015-01-23 NOTE — Patient Instructions (Signed)
Start Amoxicillin 500mg  three times daily until complete for ear infection. Ibuprofen may be used for pain. Continue Neti Pot rinses as needed. I hope you feel better soon!

## 2015-01-23 NOTE — Progress Notes (Signed)
Pre visit review using our clinic review tool, if applicable. No additional management support is needed unless otherwise documented below in the visit note. 

## 2015-02-03 ENCOUNTER — Encounter: Payer: Self-pay | Admitting: Primary Care

## 2015-02-04 ENCOUNTER — Telehealth: Payer: Self-pay | Admitting: Primary Care

## 2015-02-04 DIAGNOSIS — H6691 Otitis media, unspecified, right ear: Secondary | ICD-10-CM

## 2015-02-04 MED ORDER — AMOXICILLIN-POT CLAVULANATE 875-125 MG PO TABS: 1.0000 | ORAL_TABLET | Freq: Two times a day (BID) | ORAL | Status: AC

## 2015-02-04 NOTE — Telephone Encounter (Signed)
Spoke with Erica Brewer, and symptoms continue despite prior treatment with Amoxicillin. I will call in Augmentin to her pharmacy, and instructed her to return to the office to be seen if no resolve or worsening of symptoms in the next 3 days. She verbalized understanding.

## 2015-02-04 NOTE — Assessment & Plan Note (Signed)
Symptoms without resolve and now has fever despite full treatment with Amoxicillin. Will send RX for Augmentin. Patient to follow up with PCP if no improvement or worsening symptoms in 3 days.
